# Patient Record
Sex: Male | Born: 1960
Health system: Southern US, Community
[De-identification: ages and names within clinical notes are randomized; demographics above are authoritative.]

## PROBLEM LIST (undated history)

## (undated) DIAGNOSIS — Z973 Presence of spectacles and contact lenses: Secondary | ICD-10-CM

## (undated) DIAGNOSIS — G4730 Sleep apnea, unspecified: Secondary | ICD-10-CM

## (undated) DIAGNOSIS — S46219A Strain of muscle, fascia and tendon of other parts of biceps, unspecified arm, initial encounter: Secondary | ICD-10-CM

## (undated) DIAGNOSIS — Z889 Allergy status to unspecified drugs, medicaments and biological substances status: Secondary | ICD-10-CM

## (undated) DIAGNOSIS — M1990 Unspecified osteoarthritis, unspecified site: Secondary | ICD-10-CM

## (undated) DIAGNOSIS — E7800 Pure hypercholesterolemia, unspecified: Secondary | ICD-10-CM

## (undated) HISTORY — PX: TONSILLECTOMY: SUR1361

## (undated) HISTORY — PX: WISDOM TOOTH EXTRACTION: SHX21

## (undated) HISTORY — PX: TONSILLECTOMY AND ADENOIDECTOMY: SUR1326

## (undated) HISTORY — PX: JOINT REPLACEMENT: SHX530

## (undated) HISTORY — PX: APPENDECTOMY: SHX54

## (undated) HISTORY — PX: COLONOSCOPY: SHX174

## (undated) HISTORY — PX: FOOT SURGERY: SHX648

---

## 1998-06-02 ENCOUNTER — Emergency Department (HOSPITAL_COMMUNITY): Admission: EM | Admit: 1998-06-02 | Discharge: 1998-06-02 | Payer: Self-pay | Admitting: *Deleted

## 1998-06-02 ENCOUNTER — Encounter: Payer: Self-pay | Admitting: *Deleted

## 2006-11-25 ENCOUNTER — Encounter: Admission: RE | Admit: 2006-11-25 | Discharge: 2006-11-25 | Payer: Self-pay | Admitting: Orthopedic Surgery

## 2007-02-26 ENCOUNTER — Encounter: Admission: RE | Admit: 2007-02-26 | Discharge: 2007-02-26 | Payer: Self-pay | Admitting: Orthopedic Surgery

## 2007-07-19 ENCOUNTER — Inpatient Hospital Stay (HOSPITAL_COMMUNITY): Admission: RE | Admit: 2007-07-19 | Discharge: 2007-07-22 | Payer: Self-pay | Admitting: Orthopedic Surgery

## 2009-02-24 IMAGING — CR DG CHEST 2V
2 series · 2 of 2 positions shown · non-contrast
Comparison: None.

CLINICAL DATA: Preop for osteoarthritis of the left hip.
 CHEST ? 2 VIEW:

[w chest pa]
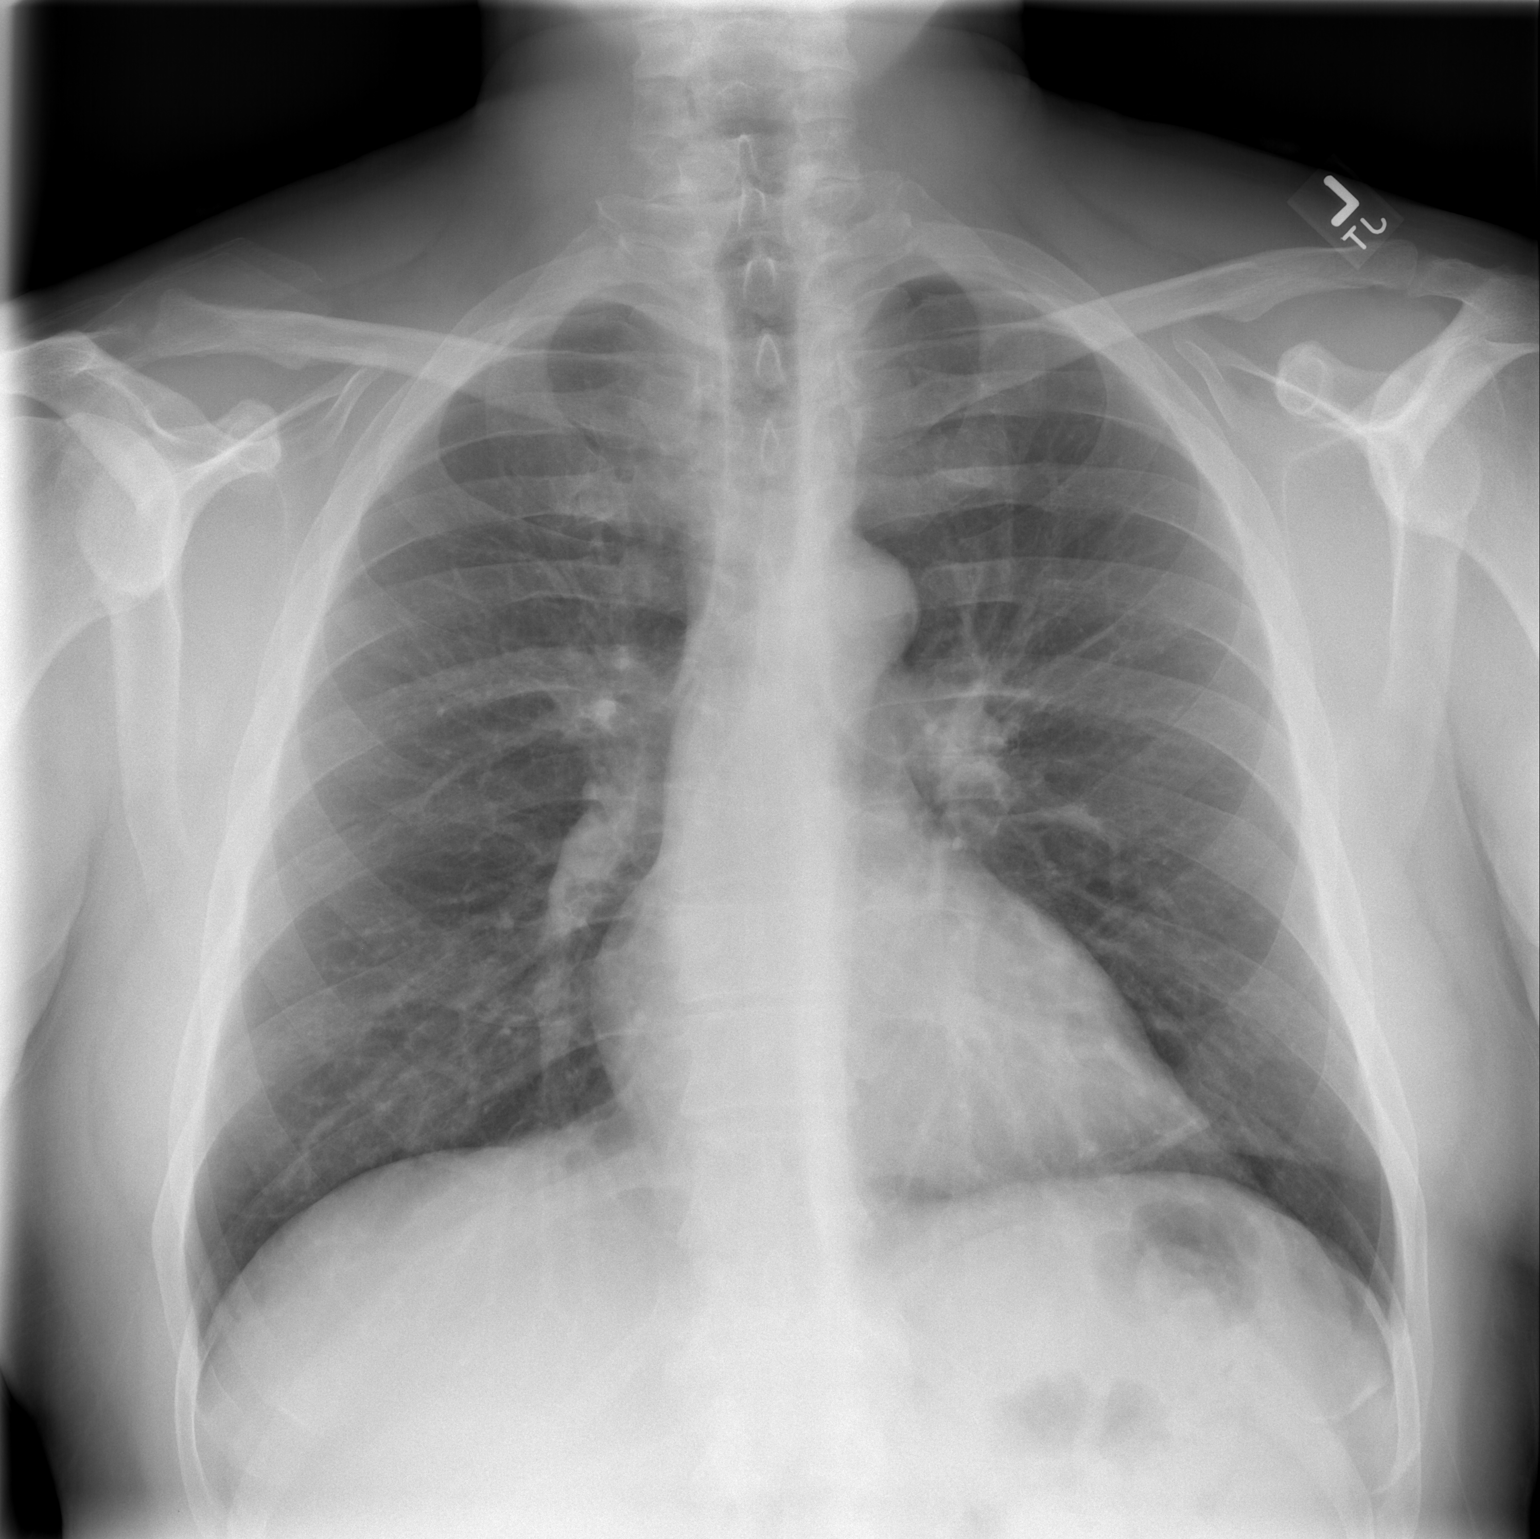

[w chest lat]
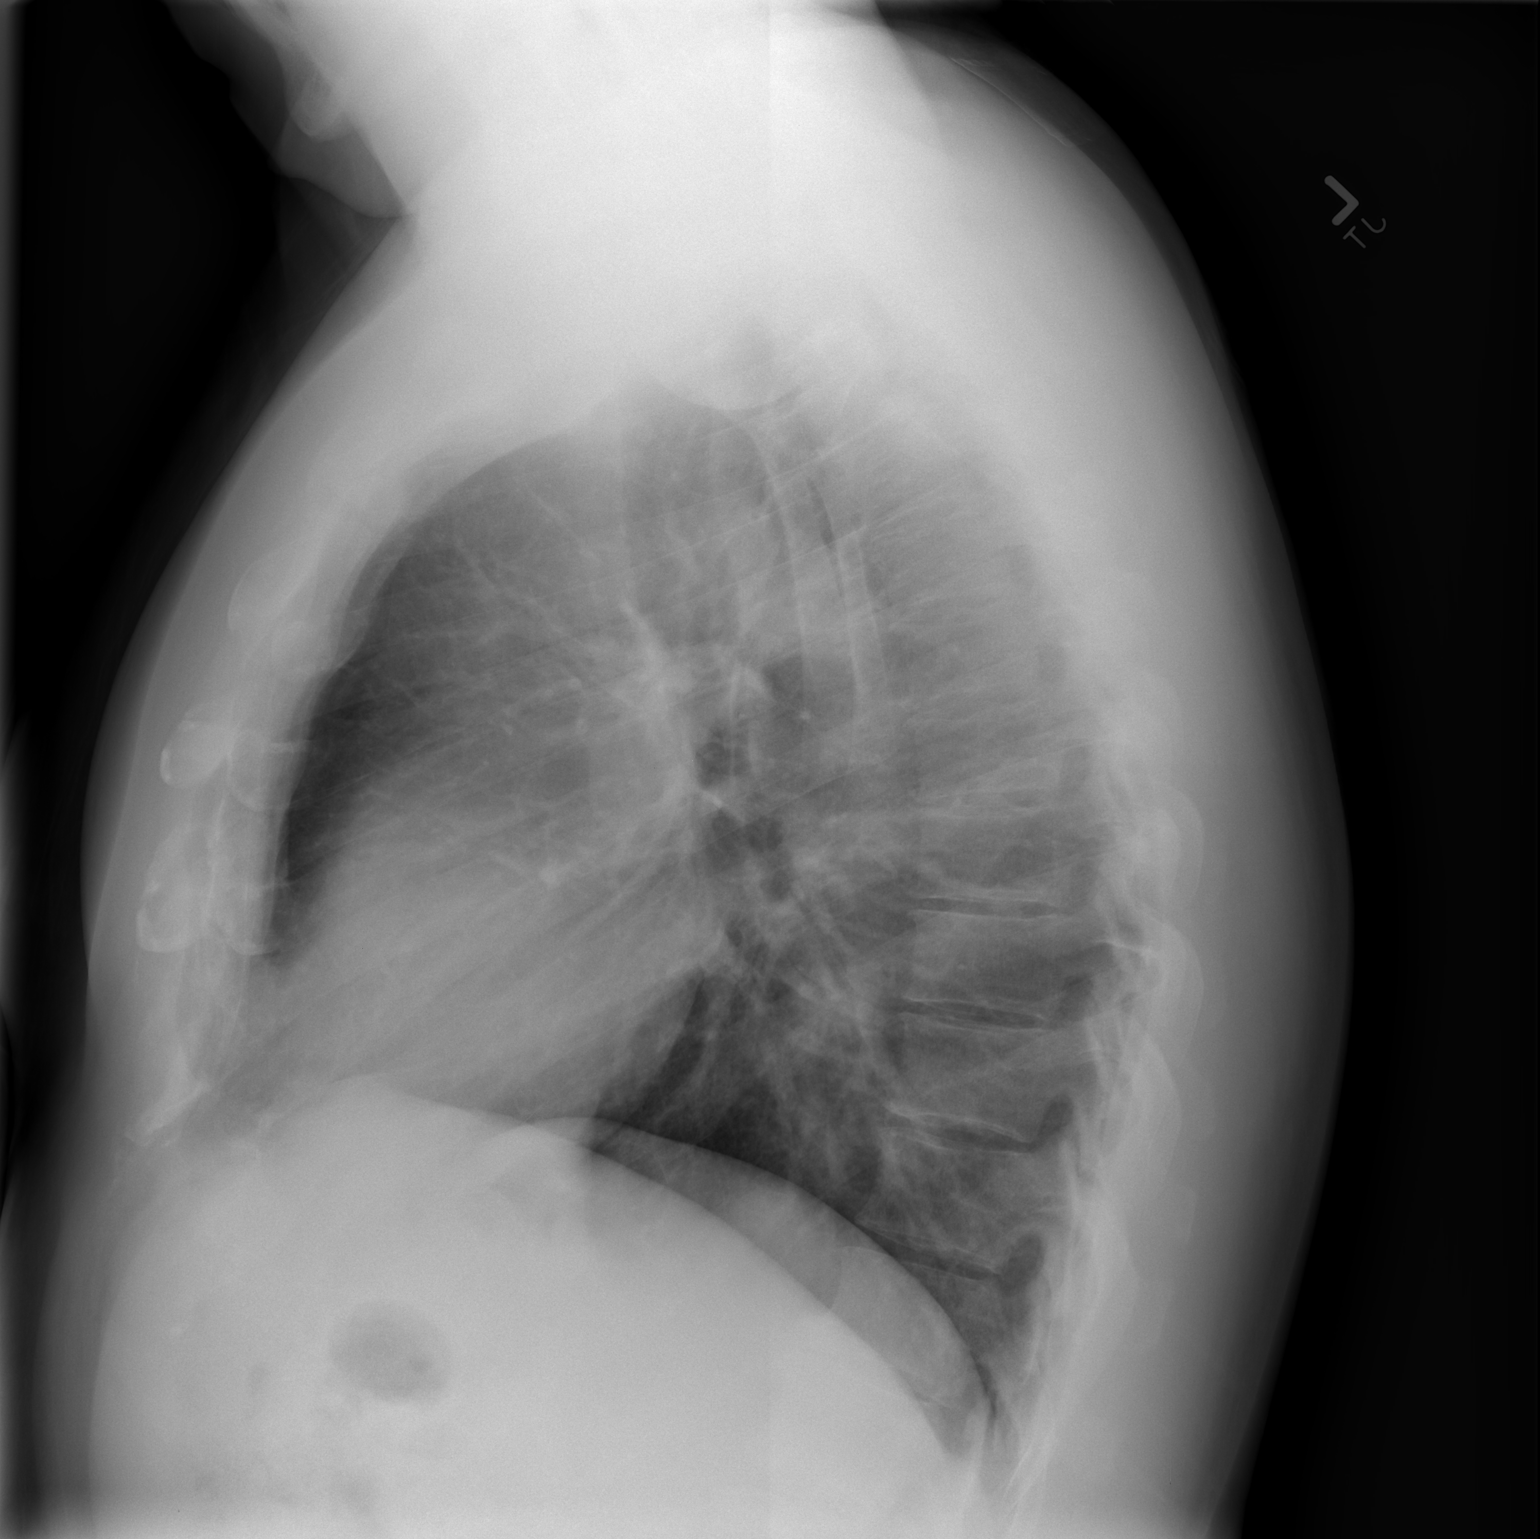

[2 of 2 positions shown; findings below may reference images not displayed]

FINDINGS: The cardiomediastinal silhouette is unremarkable.  There is no acute infiltrate or pleural effusion.  There is no pulmonary edema.
IMPRESSION: No acute cardiopulmonary disease.

## 2010-10-22 NOTE — H&P (Signed)
NAME:  LENUS, TRAUGER NO.:  [id number]   MEDICAL RECORD NO.:  [id number]         PATIENT TYPE:  LINP   LOCATION:                               FACILITY:  [facility]   PHYSICIAN:  Madlyn Frankel. Charlann Boxer, M.D.  DATE OF BIRTH:  05-May-1961   DATE OF ADMISSION:  07/19/2007  DATE OF DISCHARGE:                              HISTORY & PHYSICAL   PROCEDURE:  Left total hip arthroplasty.   CHIEF COMPLAINT:  Left hip and groin pain.   HISTORY OF PRESENT ILLNESS:  This is a 50 year old male with a history  of left hip and groin pain secondary to osteoarthritis.  It has been  refractory to all conservative treatment including two failed intra-  articular injections.  He is very active, has diminished quality of  life, and has been presurgically cleared for surgery.   PAST MEDICAL HISTORY SIGNIFICANT FOR:  1. Osteoarthritis.  2. Hypercholesteremia.   PAST SURGICAL HISTORY:  1. Appendectomy.  2. Uvula removed in 1995 for sleep apnea.   FAMILY HISTORY:  Diabetes, cancer, arthritis.   SOCIAL HISTORY:  Married, Pensions consultant.  Primary caregiver will be his wife  after surgery.   DRUG ALLERGIES:  No known drug allergies.   MEDICATIONS:  1. Mobic 7.5 mg one p.o. p.r.n.  2. Welchol 625 mg 6 tablets per day in divided doses.   REVIEW OF SYSTEMS:  None other than HPI.   PHYSICAL EXAMINATION:  VITAL SIGNS:  Pulse 64, respirations 18, blood  pressure 110/78.  GENERAL:  Awake, alert and oriented, well-developed, and well-nourished.  NECK:  Supple.  No carotid bruits.  CHEST:  Lungs are clear to auscultation bilaterally.  BREASTS:  Deferred.  HEART:  Regular rate and rhythm without gallops, clicks, rubs, or  murmurs.  ABDOMEN:  Soft, nontender, nondistended and bowel sounds are present.  GENITOURINARY:  Deferred.  EXTREMITIES:  His left hip has very limited internal range of motion as  well as flexion.  SKIN:  No cellulitis.  NEUROLOGIC:  Intact distal sensibilities.   LABS:  EKG and  chest x-ray all pending, presurgical testing.   IMPRESSION:  Left hip osteoarthritis.   PLAN OF ACTION:  Left total hip arthroplasty at [facility] on  July 19, 2007, by surgeon Dr. Durene Romans.  Questions were  encouraged, answered, and reviewed.  Risks and complications were  discussed.   Postoperative medications including Lovenox Robaxin, iron, aspirin,  Colace, and MiraLax provided at time of history and physical.  Pain  medicines will be provided at the time of surgery.     ______________________________  Yetta Glassman [address], [place]      Madlyn Frankel. Charlann Boxer, M.D.  Electronically Signed    BLM/MEDQ  D:  07/14/2007  T:  07/14/2007  Job:  [id number]   cc:   Alfonse Alpers. Dagoberto Ligas, M.D.  Fax: [phone]

## 2010-10-22 NOTE — Op Note (Signed)
NAME:  [name] [name], [name] [name] NO.:  [id number]   MEDICAL RECORD NO.:  [id number]          PATIENT TYPE:  INP   LOCATION:  1611                         FACILITY:  [facility]   PHYSICIAN:  Madlyn Frankel. Charlann Boxer, M.D.  DATE OF BIRTH:  1961/01/18   DATE OF PROCEDURE:  DATE OF DISCHARGE:                               OPERATIVE REPORT   PREOPERATIVE DIAGNOSIS:  Left hip osteoarthritis.   POSTOPERATIVE DIAGNOSIS:  Left hip osteoarthritis.   PROCEDURE:  Left total hip replacement.   COMPONENTS USED:  Pugh hip systems, 56 SR cup. A 7 high offset Tri-Lock  stem with a 49 + 2 ASR adaptor.   SURGEON:  Madlyn Frankel. Charlann Boxer, MD   ASSISTANT:  Neldon Newport   ANESTHESIA:  General.   BLOOD LOSS:  400 mL   DRAINS:  Times 1.   COMPLICATIONS:  None.   INDICATIONS FOR PROCEDURE:  Mr. [name] [name] is a 50 year old male with  advanced left hip osteoarthritis, progressive loss of function, and pain  with normal activities.  He wished to discuss options.  Based on the  decreased quality of life and radiographic, he had end-stage bone-on-  bone changes of the femoral head and acetabula changes.  There were very  little non surgical options available.  He failed 2 separate courses of  injections.  We thus discussed surgical options including the risks and  benefits of standard hip surgery, the bearing surfaces available, and  the activity level afterwards.  After this discussion, we decided on a  total hip replacement with the large metal-on-metal ASR technology.  Consent was obtained.   PROCEDURE IN DETAIL:  The patient was brought to the operative theater.  Once adequate anesthesia and preoperative antibiotics, 2 grams of Ancef  was administered.  The patient was positioned in the right lateral  decubitus position with the left side up.  All bony prominences were  well padded.  The left lower extremity was prepped and draped down to  the perineum and then pre scrubbed and prepped and draped in a  sterile  fashion.  A lateral base incision was made for a posterior approach to  the hip.  The iliotibial band and gluteus fascia were incised  posteriorly.  Short external rotators were identified and taken down and  separated from the posterior capsule.  I then made a L-capsulotomy at  the posterior capsule preserving the posterior capsule to protect the  sciatic nerve from retractors and then later anatomic repair to the  posterior leaflets.  Hip was dislocated.  Severe osteoarthritis was  noted. The neck osteotomy was then made based on anatomic landamrks and  preoperative templating.  Attention was first directed to the femur.  Using a box osteotome to insure that I was getting it lateral enough,  then I started to drill and then hammering irrigated the canal to  prevent an emboli.  I then began broaching the one broach and carried my  broach all the way up to initially to a size 6 broach. I found that my  neck cut was by a couple of millimeters. I went ahead  and used a calcar  planar to finish off the cut.   At this point, I removed this broach and packed the area with a sponge.  My attention now is to the acetabulum.  The acetabula retractors were  placed.  Following using a long-handled knife blade, I removed the  labral tissue.  I began reaming with a 43 reamer and then used a curved  reamer up to a 55 reamer.  At that point, I had an excellent prepared  bony bed.  There were some large cysts that were debrided and then  packed the femoral head with autograft.  A final 56 ASR cut was then  impacted at the curve handle with 40 degrees of abduction and 20 of  forward flexion mostly at the anterior wall based on the location of my  reamers and the cup was jsut beneath the anterior wall anteriorly.   At this point, I directed my attention to the femur and performed a  trial reduction. I first impacted a size 6 broach to the level of my  neck cut.  I was concerned that the leg  length were off by a little  given excessive shuck.  I then removed the size 6 broach and place the  7.  I used the high offset neck and trialed a plus-2 adapter head. I  felt that now the hip stability was very good. Leg length compared to  the down leg did not appear to be overly lengthened.  If anything, it is  the same length as the other leg.   At this point, all trails and components were removed.  The final canal  preparation was carried out.  The 7 high offset stem was then impacted  to the level where the braoch ahd been slightly above my original neck  cut, further assuring leg length equality.  I did retrial again. I was  very happy again with this hip stability and leg length compared to the  down leg.   At this point, and throughout the case, the hip was irrigated.  I  reapproximated the posterior capsule with the superior leaflet using a  number 1 Ethibond.  A medium Hemovac drain was placed deep.  The  iliotibial bands were reapproximated using a number 1 Ethibond and  number 1 Vicryl was used on the gluteal fascia.  The remaining wound was  closed with 2-0 Vicryl and a running 4-0 Monocryl.  The hip was clean,  dry, and dressed sterilely with Steri-Strips and a Mepilex dressing.  She was brought to the recovery room in excellent and stable condition.      Madlyn Frankel Charlann Boxer, M.D.  Electronically Signed     MDO/MEDQ  D:  07/19/2007  T:  07/20/2007  Job:  [id number]

## 2010-10-25 NOTE — Discharge Summary (Signed)
NAME:  [name] [name] NO.:  [id number]   MEDICAL RECORD NO.:  [id number]          PATIENT TYPE:  INP   LOCATION:  1611                         FACILITY:  [facility]   PHYSICIAN:  Madlyn Frankel. Charlann Boxer, M.D.  DATE OF BIRTH:  [birth year]   DATE OF ADMISSION:  07/19/2007  DATE OF DISCHARGE:  07/22/2007                               DISCHARGE SUMMARY   ADMITTING DIAGNOSES:  1. Osteoarthritis.  2. Hypercholesteremia.   DISCHARGE DIAGNOSES:  1. Osteoarthritis.  2. Hypercholesteremia.   HISTORY OF PRESENT ILLNESS:  A 50 year old male with a history of left  hip and groin pain secondary to osteoarthritis.  It was refractory to  all conservative treatment and two failed intra-articular injections.   CONSULTS:  None.   PROCEDURE:  Left total hip arthroplasty.  Surgeon was Dr. Durene Romans.  Assistant was [employer], [employer].  Components were metal-on-metal.   LABS PREADMISSION:  CBC:  Hematocrit 42.6, at discharge 24.4.  White  cell differential normal.  Coagulation normal.  Routine chemistry  preoperative:  Glucose a little high at 111, all others normal.  At  discharge, sodium 135, potassium 3.7, glucose 108.  Kidney function:  GFR greater than 60, calcium 8.2.  UA negative.   RADIOLOGY:  Chest two-view:  No acute disease.   CARDIOLOGY:  EKG:  Normal sinus rhythm.   HOSPITAL COURSE:  The patient underwent left total hip placement and  tolerated the procedure well, admitted to orthopedic floor.  Postoperative day #1, no events, afebrile.  Did have a drop in his  hematocrit to 28.6.  His dressing was clean, dry and intact.  His  Hemovac was discontinued.  Neurovascularly intact.  DVT prophylaxis,  Lovenox was started.  We stopped the Vicodin and started him on Percocet  due to increased pain.  PT evaluation done with the initial  recommendation for home health PT.   Postoperative day #2, got up to go to the bathroom, had some increased  bleeding from his wound.  His  hematocrit was 25.3, all others labs  within normal limits.  Dressing was changed.  He did have some bloody  ooze with ecchymosis.  He was neurovascularly intact to his left lower  extremity.  We held Lovenox for recheck the next day of his H&H.  We Hep-  Locked his IV.  Did well with his physical therapy, ambulated 200 feet.   Postoperative day #3, doing fine, no events, afebrile.  Hemoglobin was  8.6.  He did have ecchymosis, swelling with underlying intramuscular  hematoma.  Neurovascularly intact with continued plans for discharge  home with home health care PT due to ample progress.   DISCHARGE DISPOSITION:  Discharged home in stable, improved condition.   DISCHARGE DIET:  Regular.   DISCHARGE WOUND CARE:  Keep dry.   DISCHARGE PHYSICAL THERAPY:  Weightbearing as tolerated with the use of  a rolling walker.   DISCHARGE MEDICATIONS:  1. Lovenox.  2. Robaxin.  3. Iron.  4. Aspirin.  5. Colace.  6. MiraLax.  7. Percocet.   HOME MEDICATIONS:  1. Welchol 650 mg three tablets b.i.d.  2. Tylenol p.r.n.   DISCHARGE FOLLOW-UP:  Dr. Charlann Boxer, phone number [phone] in 10 to 14 days.     ______________________________  Yetta Glassman [address], [place]      Madlyn Frankel. Charlann Boxer, M.D.  Electronically Signed    BLM/MEDQ  D:  08/31/2007  T:  08/31/2007  Job:  [id number]

## 2011-02-28 LAB — DIFFERENTIAL
Basophils Absolute: 0
Basophils Relative: 0
Eosinophils Absolute: 0.2
Eosinophils Relative: 3
Lymphocytes Relative: 23
Lymphs Abs: 1.5
Monocytes Absolute: 0.4
Monocytes Relative: 7
Neutro Abs: 4.2
Neutrophils Relative %: 67

## 2011-02-28 LAB — URINALYSIS, ROUTINE W REFLEX MICROSCOPIC
Bilirubin Urine: NEGATIVE
Glucose, UA: NEGATIVE
Hgb urine dipstick: NEGATIVE
Ketones, ur: NEGATIVE
Nitrite: NEGATIVE
Protein, ur: NEGATIVE
Specific Gravity, Urine: 1.014
Urobilinogen, UA: 0.2
pH: 7

## 2011-02-28 LAB — CBC
HCT: 25.3 — ABNORMAL LOW
HCT: 28.6 — ABNORMAL LOW
HCT: 42.6
Hemoglobin: 10.1 — ABNORMAL LOW
Hemoglobin: 14.9
Hemoglobin: 8.9 — ABNORMAL LOW
MCHC: 35
MCHC: 35.2
MCHC: 35.3
MCV: 86.4
MCV: 86.9
MCV: 87.2
Platelets: 171
Platelets: 204
Platelets: 241
RBC: 2.9 — ABNORMAL LOW
RBC: 3.29 — ABNORMAL LOW
RBC: 4.93
RDW: 13.2
RDW: 13.3
RDW: 13.4
WBC: 5.8
WBC: 5.9
WBC: 6.3

## 2011-02-28 LAB — BASIC METABOLIC PANEL
BUN: 10
BUN: 17
BUN: 3 — ABNORMAL LOW
CO2: 29
CO2: 30
CO2: 30
Calcium: 8.1 — ABNORMAL LOW
Calcium: 8.2 — ABNORMAL LOW
Calcium: 9.8
Chloride: 101
Chloride: 102
Chloride: 102
Creatinine, Ser: 0.91
Creatinine, Ser: 0.97
Creatinine, Ser: 1.02
GFR calc Af Amer: >60
GFR calc Af Amer: >60
GFR calc Af Amer: >60
GFR calc non Af Amer: >60
GFR calc non Af Amer: >60
GFR calc non Af Amer: >60
Glucose, Bld: 108 — ABNORMAL HIGH
Glucose, Bld: 111 — ABNORMAL HIGH
Glucose, Bld: 125 — ABNORMAL HIGH
Potassium: 3.7
Potassium: 3.8
Potassium: 4.8
Sodium: 135
Sodium: 135
Sodium: 139

## 2011-02-28 LAB — APTT: aPTT: 30

## 2011-02-28 LAB — ABO/RH: ABO/RH(D): O POS

## 2011-02-28 LAB — TYPE AND SCREEN
ABO/RH(D): O POS
Antibody Screen: NEGATIVE

## 2011-02-28 LAB — PROTIME-INR
INR: 0.9
Prothrombin Time: 12.8

## 2011-02-28 LAB — HEMOGLOBIN AND HEMATOCRIT, BLOOD
HCT: 24.4 — ABNORMAL LOW
Hemoglobin: 8.6 — ABNORMAL LOW

## 2011-08-26 DIAGNOSIS — C4491 Basal cell carcinoma of skin, unspecified: Secondary | ICD-10-CM

## 2011-08-26 HISTORY — DX: Basal cell carcinoma of skin, unspecified: C44.91

## 2014-09-13 DIAGNOSIS — E663 Overweight: Secondary | ICD-10-CM | POA: Insufficient documentation

## 2014-09-13 DIAGNOSIS — J309 Allergic rhinitis, unspecified: Secondary | ICD-10-CM | POA: Insufficient documentation

## 2014-09-13 DIAGNOSIS — G4733 Obstructive sleep apnea (adult) (pediatric): Secondary | ICD-10-CM | POA: Insufficient documentation

## 2015-09-20 DIAGNOSIS — G4733 Obstructive sleep apnea (adult) (pediatric): Secondary | ICD-10-CM | POA: Diagnosis not present

## 2015-09-20 DIAGNOSIS — E291 Testicular hypofunction: Secondary | ICD-10-CM | POA: Insufficient documentation

## 2015-09-20 DIAGNOSIS — E7800 Pure hypercholesterolemia, unspecified: Secondary | ICD-10-CM | POA: Insufficient documentation

## 2015-09-20 DIAGNOSIS — R61 Generalized hyperhidrosis: Secondary | ICD-10-CM | POA: Insufficient documentation

## 2015-09-20 DIAGNOSIS — G4700 Insomnia, unspecified: Secondary | ICD-10-CM | POA: Insufficient documentation

## 2015-09-20 DIAGNOSIS — M2550 Pain in unspecified joint: Secondary | ICD-10-CM | POA: Insufficient documentation

## 2015-09-20 DIAGNOSIS — N529 Male erectile dysfunction, unspecified: Secondary | ICD-10-CM | POA: Insufficient documentation

## 2015-10-22 DIAGNOSIS — E291 Testicular hypofunction: Secondary | ICD-10-CM | POA: Diagnosis not present

## 2015-10-22 DIAGNOSIS — E7800 Pure hypercholesterolemia, unspecified: Secondary | ICD-10-CM | POA: Diagnosis not present

## 2015-10-22 DIAGNOSIS — G4733 Obstructive sleep apnea (adult) (pediatric): Secondary | ICD-10-CM | POA: Diagnosis not present

## 2015-10-22 DIAGNOSIS — Z125 Encounter for screening for malignant neoplasm of prostate: Secondary | ICD-10-CM | POA: Diagnosis not present

## 2015-10-22 DIAGNOSIS — Z23 Encounter for immunization: Secondary | ICD-10-CM | POA: Diagnosis not present

## 2015-10-22 DIAGNOSIS — R030 Elevated blood-pressure reading, without diagnosis of hypertension: Secondary | ICD-10-CM | POA: Diagnosis not present

## 2015-10-22 DIAGNOSIS — N529 Male erectile dysfunction, unspecified: Secondary | ICD-10-CM | POA: Diagnosis not present

## 2015-10-22 DIAGNOSIS — Z0000 Encounter for general adult medical examination without abnormal findings: Secondary | ICD-10-CM | POA: Diagnosis not present

## 2015-12-07 DIAGNOSIS — Z125 Encounter for screening for malignant neoplasm of prostate: Secondary | ICD-10-CM | POA: Diagnosis not present

## 2015-12-07 DIAGNOSIS — E7800 Pure hypercholesterolemia, unspecified: Secondary | ICD-10-CM | POA: Diagnosis not present

## 2015-12-19 DIAGNOSIS — D485 Neoplasm of uncertain behavior of skin: Secondary | ICD-10-CM | POA: Diagnosis not present

## 2015-12-19 DIAGNOSIS — L821 Other seborrheic keratosis: Secondary | ICD-10-CM | POA: Diagnosis not present

## 2015-12-19 DIAGNOSIS — L570 Actinic keratosis: Secondary | ICD-10-CM | POA: Diagnosis not present

## 2016-02-20 DIAGNOSIS — L570 Actinic keratosis: Secondary | ICD-10-CM | POA: Diagnosis not present

## 2016-02-20 DIAGNOSIS — D485 Neoplasm of uncertain behavior of skin: Secondary | ICD-10-CM | POA: Diagnosis not present

## 2016-03-25 DIAGNOSIS — G4733 Obstructive sleep apnea (adult) (pediatric): Secondary | ICD-10-CM | POA: Diagnosis not present

## 2016-04-14 DIAGNOSIS — M2011 Hallux valgus (acquired), right foot: Secondary | ICD-10-CM | POA: Diagnosis not present

## 2016-04-14 DIAGNOSIS — M205X9 Other deformities of toe(s) (acquired), unspecified foot: Secondary | ICD-10-CM | POA: Insufficient documentation

## 2016-04-14 DIAGNOSIS — M205X1 Other deformities of toe(s) (acquired), right foot: Secondary | ICD-10-CM | POA: Insufficient documentation

## 2016-04-14 DIAGNOSIS — M2012 Hallux valgus (acquired), left foot: Secondary | ICD-10-CM | POA: Diagnosis not present

## 2016-04-14 DIAGNOSIS — M2010 Hallux valgus (acquired), unspecified foot: Secondary | ICD-10-CM | POA: Insufficient documentation

## 2016-04-14 DIAGNOSIS — M205X2 Other deformities of toe(s) (acquired), left foot: Secondary | ICD-10-CM | POA: Diagnosis not present

## 2016-08-07 DIAGNOSIS — F4325 Adjustment disorder with mixed disturbance of emotions and conduct: Secondary | ICD-10-CM | POA: Diagnosis not present

## 2016-08-28 DIAGNOSIS — F4325 Adjustment disorder with mixed disturbance of emotions and conduct: Secondary | ICD-10-CM | POA: Diagnosis not present

## 2016-09-19 DIAGNOSIS — E291 Testicular hypofunction: Secondary | ICD-10-CM | POA: Diagnosis not present

## 2016-09-29 DIAGNOSIS — F4325 Adjustment disorder with mixed disturbance of emotions and conduct: Secondary | ICD-10-CM | POA: Diagnosis not present

## 2016-10-28 DIAGNOSIS — M2550 Pain in unspecified joint: Secondary | ICD-10-CM | POA: Diagnosis not present

## 2016-10-28 DIAGNOSIS — E7800 Pure hypercholesterolemia, unspecified: Secondary | ICD-10-CM | POA: Diagnosis not present

## 2016-10-28 DIAGNOSIS — E291 Testicular hypofunction: Secondary | ICD-10-CM | POA: Diagnosis not present

## 2016-10-28 DIAGNOSIS — Z0000 Encounter for general adult medical examination without abnormal findings: Secondary | ICD-10-CM | POA: Diagnosis not present

## 2016-10-28 DIAGNOSIS — N529 Male erectile dysfunction, unspecified: Secondary | ICD-10-CM | POA: Diagnosis not present

## 2016-11-07 DIAGNOSIS — Z125 Encounter for screening for malignant neoplasm of prostate: Secondary | ICD-10-CM | POA: Diagnosis not present

## 2016-11-07 DIAGNOSIS — E7800 Pure hypercholesterolemia, unspecified: Secondary | ICD-10-CM | POA: Diagnosis not present

## 2016-11-20 DIAGNOSIS — F4325 Adjustment disorder with mixed disturbance of emotions and conduct: Secondary | ICD-10-CM | POA: Diagnosis not present

## 2016-12-08 DIAGNOSIS — E291 Testicular hypofunction: Secondary | ICD-10-CM | POA: Diagnosis not present

## 2017-02-11 DIAGNOSIS — Z96642 Presence of left artificial hip joint: Secondary | ICD-10-CM | POA: Diagnosis not present

## 2017-02-13 DIAGNOSIS — M25552 Pain in left hip: Secondary | ICD-10-CM | POA: Diagnosis not present

## 2017-02-13 DIAGNOSIS — Z96642 Presence of left artificial hip joint: Secondary | ICD-10-CM | POA: Diagnosis not present

## 2017-08-28 DIAGNOSIS — G4733 Obstructive sleep apnea (adult) (pediatric): Secondary | ICD-10-CM | POA: Diagnosis not present

## 2017-09-13 ENCOUNTER — Ambulatory Visit (INDEPENDENT_AMBULATORY_CARE_PROVIDER_SITE_OTHER): Payer: Self-pay | Admitting: Orthopedic Surgery

## 2017-09-13 DIAGNOSIS — S46212A Strain of muscle, fascia and tendon of other parts of biceps, left arm, initial encounter: Secondary | ICD-10-CM

## 2017-09-14 ENCOUNTER — Encounter (HOSPITAL_COMMUNITY): Payer: Self-pay | Admitting: *Deleted

## 2017-09-14 ENCOUNTER — Other Ambulatory Visit (INDEPENDENT_AMBULATORY_CARE_PROVIDER_SITE_OTHER): Payer: Self-pay | Admitting: Orthopedic Surgery

## 2017-09-14 ENCOUNTER — Other Ambulatory Visit: Payer: Self-pay

## 2017-09-14 ENCOUNTER — Encounter (INDEPENDENT_AMBULATORY_CARE_PROVIDER_SITE_OTHER): Payer: Self-pay | Admitting: Orthopedic Surgery

## 2017-09-14 DIAGNOSIS — S46212A Strain of muscle, fascia and tendon of other parts of biceps, left arm, initial encounter: Secondary | ICD-10-CM

## 2017-09-14 NOTE — Progress Notes (Signed)
Pt denies SOB, chest pain, and being under the care of a cardiologist. Pt denies having a stress test, echo and cardiac cath. Pt denies having an EKG and chest x ray within the last year. Pt denies recent labs. Pt made aware to stop taking vitamins, fish oil and herbal medications. Do not take any NSAIDs ie: Ibuprofen, Advil, Naproxen (Aleve), Mobic, Motrin, BC and Goody Powder. Pt verbalized understanding of all pre-op instructions.

## 2017-09-14 NOTE — Anesthesia Preprocedure Evaluation (Addendum)
Anesthesia Evaluation  Patient identified by MRN, date of birth, ID band Patient awake    Reviewed: Allergy & Precautions, H&P , NPO status , Patient's Chart, lab work & pertinent test results  Airway Mallampati: II  TM Distance: >3 FB Neck ROM: Full    Dental no notable dental hx.    Pulmonary sleep apnea and Continuous Positive Airway Pressure Ventilation ,    Pulmonary exam normal breath sounds clear to auscultation       Cardiovascular Exercise Tolerance: Good Normal cardiovascular exam Rhythm:Regular Rate:Normal     Neuro/Psych negative neurological ROS  negative psych ROS   GI/Hepatic Neg liver ROS,   Endo/Other  negative endocrine ROS  Renal/GU negative Renal ROS     Musculoskeletal   Abdominal   Peds  Hematology negative hematology ROS (+)   Anesthesia Other Findings   Reproductive/Obstetrics                          Lab Results  Component Value Date   WBC 4.8 09/15/2017   HGB 15.8 09/15/2017   HCT 45.6 09/15/2017   MCV 87.4 09/15/2017   PLT 195 09/15/2017    Anesthesia Physical Anesthesia Plan  ASA: II  Anesthesia Plan: General and Regional   Post-op Pain Management:  Regional for Post-op pain   Induction: Intravenous  PONV Risk Score and Plan: Treatment may vary due to age or medical condition and Ondansetron  Airway Management Planned: Oral ETT  Additional Equipment:   Intra-op Plan:   Post-operative Plan: Extubation in OR  Informed Consent: I have reviewed the patients History and Physical, chart, labs and discussed the procedure including the risks, benefits and alternatives for the proposed anesthesia with the patient or authorized representative who has indicated his/her understanding and acceptance.   Dental advisory given  Plan Discussed with: CRNA  Anesthesia Plan Comments:         Anesthesia Quick Evaluation

## 2017-09-14 NOTE — Progress Notes (Signed)
   Office Visit Note   Patient: [name] [name]           Date of Birth: [birth year]           MRN: [id number] Visit Date: 09/13/2017 Requested by: No referring provider defined for this encounter. PCP: No primary care provider on file.  Subjective: No chief complaint on file.   HPI: It is a patient whom I am seeing at his house.  2 weeks ago he was in a tennis match and felt pain in the left elbow.  He is right-hand dominant.  He is gone on to develop persistent pain and weakness in that left arm.  He did have some medial sided ecchymosis and swelling which has resolved.  He denies any numbness or tingling in his fingers.              ROS: All systems reviewed are negative as they relate to the chief complaint within the history of present illness.  Patient denies  fevers or chills.   Assessment & Plan: Visit Diagnoses:  1. Biceps rupture, distal, left, initial encounter     Plan: Impression is left distal biceps rupture.  Plan is reattachment.  He is not yet 2 weeks out.  He does have clear and obvious retraction and difference in contours of the distal biceps as well as weakness to supination.  Risks and benefits are discussed including but not limited to infection nerve vessel damage heterotopic ossification.  Patient understands the risk and benefits and wishes to proceed.  We will do this on an expedited basis..  Follow-Up Instructions: No follow-ups on file.   Orders:  No orders of the defined types were placed in this encounter.  No orders of the defined types were placed in this encounter.     Procedures: No procedures performed   Clinical Data: No additional findings.  Objective: Vital Signs: There were no vitals taken for this visit.  Physical Exam:   Constitutional: Patient appears well-developed HEENT:  Head: Normocephalic Eyes:EOM are normal Neck: Normal range of motion Cardiovascular: Normal rate Pulmonary/chest: Effort normal Neurologic: Patient is  alert Skin: Skin is warm Psychiatric: Patient has normal mood and affect    Ortho Exam: Orthopedic exam demonstrates proximal migration of the distal biceps.  Patient has no palpable biceps tendon within the bicipital groove.  He has weakness and pain with resisted supination left versus right.  Elbow range of motion is full.  Radial pulse is intact.  EPL FPL interosseous is intact.  Specialty Comments:  No specialty comments available.  Imaging: No results found.   PMFS History: There are no active problems to display for this patient.  History reviewed. No pertinent past medical history.  History reviewed. No pertinent family history.  History reviewed. No pertinent surgical history. Social History   Occupational History  . Not on file  Tobacco Use  . Smoking status: Not on file  Substance and Sexual Activity  . Alcohol use: Not on file  . Drug use: Not on file  . Sexual activity: Not on file

## 2017-09-15 ENCOUNTER — Ambulatory Visit (HOSPITAL_COMMUNITY): Payer: BLUE CROSS/BLUE SHIELD | Admitting: Anesthesiology

## 2017-09-15 ENCOUNTER — Encounter (HOSPITAL_COMMUNITY)
Admission: RE | Disposition: A | Discharge status: Home / Self Care | Payer: Self-pay | Source: Ambulatory Visit | Attending: Orthopedic Surgery

## 2017-09-15 ENCOUNTER — Encounter (HOSPITAL_COMMUNITY): Payer: Self-pay | Admitting: Anesthesiology

## 2017-09-15 ENCOUNTER — Ambulatory Visit (HOSPITAL_COMMUNITY)
Admission: RE | Admit: 2017-09-15 | Discharge: 2017-09-15 | Disposition: A | Discharge status: Home / Self Care | Payer: BLUE CROSS/BLUE SHIELD | Source: Ambulatory Visit | Attending: Orthopedic Surgery | Admitting: Orthopedic Surgery

## 2017-09-15 DIAGNOSIS — Z820 Family history of epilepsy and other diseases of the nervous system: Secondary | ICD-10-CM | POA: Insufficient documentation

## 2017-09-15 DIAGNOSIS — Z833 Family history of diabetes mellitus: Secondary | ICD-10-CM | POA: Diagnosis not present

## 2017-09-15 DIAGNOSIS — S46212A Strain of muscle, fascia and tendon of other parts of biceps, left arm, initial encounter: Secondary | ICD-10-CM | POA: Insufficient documentation

## 2017-09-15 DIAGNOSIS — Y9389 Activity, other specified: Secondary | ICD-10-CM | POA: Insufficient documentation

## 2017-09-15 DIAGNOSIS — Z79899 Other long term (current) drug therapy: Secondary | ICD-10-CM | POA: Diagnosis not present

## 2017-09-15 DIAGNOSIS — Z888 Allergy status to other drugs, medicaments and biological substances status: Secondary | ICD-10-CM | POA: Diagnosis not present

## 2017-09-15 DIAGNOSIS — G4730 Sleep apnea, unspecified: Secondary | ICD-10-CM | POA: Insufficient documentation

## 2017-09-15 DIAGNOSIS — Z96643 Presence of artificial hip joint, bilateral: Secondary | ICD-10-CM | POA: Insufficient documentation

## 2017-09-15 DIAGNOSIS — G8918 Other acute postprocedural pain: Secondary | ICD-10-CM | POA: Diagnosis not present

## 2017-09-15 DIAGNOSIS — X58XXXA Exposure to other specified factors, initial encounter: Secondary | ICD-10-CM | POA: Diagnosis not present

## 2017-09-15 DIAGNOSIS — Z8249 Family history of ischemic heart disease and other diseases of the circulatory system: Secondary | ICD-10-CM | POA: Diagnosis not present

## 2017-09-15 DIAGNOSIS — E7800 Pure hypercholesterolemia, unspecified: Secondary | ICD-10-CM | POA: Insufficient documentation

## 2017-09-15 HISTORY — DX: Strain of muscle, fascia and tendon of other parts of biceps, unspecified arm, initial encounter: S46.219A

## 2017-09-15 HISTORY — DX: Pure hypercholesterolemia, unspecified: E78.00

## 2017-09-15 HISTORY — DX: Presence of spectacles and contact lenses: Z97.3

## 2017-09-15 HISTORY — DX: Sleep apnea, unspecified: G47.30

## 2017-09-15 HISTORY — PX: DISTAL BICEPS TENDON REPAIR: SHX1461

## 2017-09-15 HISTORY — DX: Unspecified osteoarthritis, unspecified site: M19.90

## 2017-09-15 HISTORY — DX: Allergy status to unspecified drugs, medicaments and biological substances: Z88.9

## 2017-09-15 LAB — CBC
HCT: 45.6 % (ref 39.0–52.0)
Hemoglobin: 15.8 g/dL (ref 13.0–17.0)
MCH: 30.3 pg (ref 26.0–34.0)
MCHC: 34.6 g/dL (ref 30.0–36.0)
MCV: 87.4 fL (ref 78.0–100.0)
Platelets: 195 10*3/uL (ref 150–400)
RBC: 5.22 MIL/uL (ref 4.22–5.81)
RDW: 13 % (ref 11.5–15.5)
WBC: 4.8 10*3/uL (ref 4.0–10.5)

## 2017-09-15 SURGERY — REPAIR, TENDON, BICEPS, DISTAL
Anesthesia: Regional | Site: Arm Upper | Laterality: Left

## 2017-09-15 MED ORDER — MORPHINE SULFATE (PF) 4 MG/ML IV SOLN
INTRAVENOUS | Status: DC | PRN
  Administered 2017-09-15: 8 mg

## 2017-09-15 MED ORDER — CHLORHEXIDINE GLUCONATE 4 % EX LIQD: 60.0000 mL | Freq: Once | CUTANEOUS | Status: DC

## 2017-09-15 MED ORDER — FENTANYL CITRATE (PF) 100 MCG/2ML IJ SOLN
INTRAMUSCULAR | Status: AC
  Filled 2017-09-15: qty 2

## 2017-09-15 MED ORDER — MIDAZOLAM HCL 5 MG/5ML IJ SOLN
INTRAMUSCULAR | Status: DC | PRN
  Administered 2017-09-15 (×2): 1 mg via INTRAVENOUS

## 2017-09-15 MED ORDER — BUPIVACAINE HCL (PF) 0.25 % IJ SOLN
INTRAMUSCULAR | Status: AC
  Filled 2017-09-15: qty 30

## 2017-09-15 MED ORDER — KETOROLAC TROMETHAMINE 30 MG/ML IJ SOLN
INTRAMUSCULAR | Status: DC | PRN
  Administered 2017-09-15: 30 mg via INTRAVENOUS

## 2017-09-15 MED ORDER — FENTANYL CITRATE (PF) 100 MCG/2ML IJ SOLN
INTRAMUSCULAR | Status: DC | PRN
  Administered 2017-09-15: 25 ug via INTRAVENOUS
  Administered 2017-09-15: 50 ug via INTRAVENOUS

## 2017-09-15 MED ORDER — LACTATED RINGERS IV SOLN
INTRAVENOUS | Status: DC | PRN
  Administered 2017-09-15 (×2): via INTRAVENOUS

## 2017-09-15 MED ORDER — CEFAZOLIN SODIUM-DEXTROSE 2-4 GM/100ML-% IV SOLN
2.0000 g | INTRAVENOUS | Status: AC
  Administered 2017-09-15: 2 g via INTRAVENOUS
  Filled 2017-09-15: qty 100

## 2017-09-15 MED ORDER — ONDANSETRON HCL 4 MG/2ML IJ SOLN
INTRAMUSCULAR | Status: DC | PRN
  Administered 2017-09-15: 4 mg via INTRAVENOUS

## 2017-09-15 MED ORDER — 0.9 % SODIUM CHLORIDE (POUR BTL) OPTIME
TOPICAL | Status: DC | PRN
  Administered 2017-09-15 (×2): 1000 mL

## 2017-09-15 MED ORDER — PROPOFOL 10 MG/ML IV BOLUS
INTRAVENOUS | Status: DC | PRN
  Administered 2017-09-15: 150 mg via INTRAVENOUS
  Administered 2017-09-15: 50 mg via INTRAVENOUS

## 2017-09-15 MED ORDER — ROPIVACAINE HCL 5 MG/ML IJ SOLN
INTRAMUSCULAR | Status: DC | PRN
  Administered 2017-09-15: 30 mL via PERINEURAL

## 2017-09-15 MED ORDER — BUPIVACAINE HCL (PF) 0.25 % IJ SOLN
INTRAMUSCULAR | Status: DC | PRN
  Administered 2017-09-15: 20 mL

## 2017-09-15 MED ORDER — ACETAMINOPHEN 10 MG/ML IV SOLN: 1000.0000 mg | Freq: Once | INTRAVENOUS | Status: DC | PRN

## 2017-09-15 MED ORDER — CLONIDINE HCL (ANALGESIA) 100 MCG/ML EP SOLN
150.0000 ug | Freq: Once | EPIDURAL | Status: AC
  Administered 2017-09-15: 1 mL via INTRA_ARTICULAR
  Filled 2017-09-15: qty 10

## 2017-09-15 MED ORDER — HYDROMORPHONE HCL 1 MG/ML IJ SOLN: 0.2500 mg | INTRAMUSCULAR | Status: DC | PRN

## 2017-09-15 MED ORDER — EPHEDRINE SULFATE 50 MG/ML IJ SOLN
INTRAMUSCULAR | Status: DC | PRN
  Administered 2017-09-15: 5 mg via INTRAVENOUS

## 2017-09-15 MED ORDER — MEPERIDINE HCL 50 MG/ML IJ SOLN: 6.2500 mg | INTRAMUSCULAR | Status: DC | PRN

## 2017-09-15 MED ORDER — DEXAMETHASONE SODIUM PHOSPHATE 10 MG/ML IJ SOLN
INTRAMUSCULAR | Status: DC | PRN
  Administered 2017-09-15: 10 mg via INTRAVENOUS

## 2017-09-15 MED ORDER — MORPHINE SULFATE (PF) 4 MG/ML IV SOLN
INTRAVENOUS | Status: AC
Start: 2017-09-15 — End: ?
  Filled 2017-09-15: qty 2

## 2017-09-15 MED ORDER — HYDROCODONE-ACETAMINOPHEN 7.5-325 MG PO TABS: 1.0000 | ORAL_TABLET | Freq: Once | ORAL | Status: DC | PRN

## 2017-09-15 SURGICAL SUPPLY — 65 items
ALCOHOL 70% 16 OZ (MISCELLANEOUS) ×2 IMPLANT
BANDAGE ACE 3X5.8 VEL STRL LF (GAUZE/BANDAGES/DRESSINGS) ×2 IMPLANT
BANDAGE ACE 4X5 VEL STRL LF (GAUZE/BANDAGES/DRESSINGS) ×2 IMPLANT
BNDG COHESIVE 4X5 TAN STRL (GAUZE/BANDAGES/DRESSINGS) ×2 IMPLANT
BNDG ESMARK 4X9 LF (GAUZE/BANDAGES/DRESSINGS) ×2 IMPLANT
CORDS BIPOLAR (ELECTRODE) ×2 IMPLANT
COVER SURGICAL LIGHT HANDLE (MISCELLANEOUS) ×2 IMPLANT
CUFF TOURNIQUET SINGLE 18IN (TOURNIQUET CUFF) ×2 IMPLANT
CUFF TOURNIQUET SINGLE 24IN (TOURNIQUET CUFF) ×2 IMPLANT
DECANTER SPIKE VIAL GLASS SM (MISCELLANEOUS) IMPLANT
DRAPE INCISE IOBAN 66X45 STRL (DRAPES) ×4 IMPLANT
DRAPE OEC MINIVIEW 54X84 (DRAPES) ×2 IMPLANT
DRSG PAD ABDOMINAL 8X10 ST (GAUZE/BANDAGES/DRESSINGS) ×2 IMPLANT
DRSG TEGADERM 4X4.75 (GAUZE/BANDAGES/DRESSINGS) ×2 IMPLANT
DURAPREP 26ML APPLICATOR (WOUND CARE) ×2 IMPLANT
GAUZE SPONGE 4X4 12PLY STRL (GAUZE/BANDAGES/DRESSINGS) ×2 IMPLANT
GAUZE XEROFORM 1X8 LF (GAUZE/BANDAGES/DRESSINGS) ×2 IMPLANT
GLOVE BIOGEL PI IND STRL 7.5 (GLOVE) ×1 IMPLANT
GLOVE BIOGEL PI IND STRL 8 (GLOVE) ×1 IMPLANT
GLOVE BIOGEL PI INDICATOR 7.5 (GLOVE) ×1
GLOVE BIOGEL PI INDICATOR 8 (GLOVE) ×1
GLOVE ECLIPSE 7.0 STRL STRAW (GLOVE) ×2 IMPLANT
GLOVE SURG ORTHO 8.0 STRL STRW (GLOVE) ×2 IMPLANT
GOWN STRL REUS W/ TWL LRG LVL3 (GOWN DISPOSABLE) ×2 IMPLANT
GOWN STRL REUS W/TWL LRG LVL3 (GOWN DISPOSABLE) ×2
KIT BASIN OR (CUSTOM PROCEDURE TRAY) ×2 IMPLANT
KIT TURNOVER KIT B (KITS) ×2 IMPLANT
LOOP VESSEL MINI RED (MISCELLANEOUS) ×2 IMPLANT
MANIFOLD NEPTUNE II (INSTRUMENTS) IMPLANT
NDL SUT 6 .5 CRC .975X.05 MAYO (NEEDLE) ×1 IMPLANT
NEEDLE 18GX1X1/2 (RX/OR ONLY) (NEEDLE) ×2 IMPLANT
NEEDLE 22X1 1/2 (OR ONLY) (NEEDLE) IMPLANT
NEEDLE MAYO TAPER (NEEDLE) ×1
NS IRRIG 1000ML POUR BTL (IV SOLUTION) ×2 IMPLANT
PACK ORTHO EXTREMITY (CUSTOM PROCEDURE TRAY) ×2 IMPLANT
PAD ARMBOARD 7.5X6 YLW CONV (MISCELLANEOUS) ×4 IMPLANT
PAD CAST 3X4 CTTN HI CHSV (CAST SUPPLIES) ×1 IMPLANT
PAD CAST 4YDX4 CTTN HI CHSV (CAST SUPPLIES) ×1 IMPLANT
PADDING CAST COTTON 3X4 STRL (CAST SUPPLIES) ×1
PADDING CAST COTTON 4X4 STRL (CAST SUPPLIES) ×1
PASSER SUT SWANSON 36MM LOOP (INSTRUMENTS) ×2 IMPLANT
SLING ARM IMMOBILIZER LRG (SOFTGOODS) ×2 IMPLANT
SOL PREP POV-IOD 4OZ 10% (MISCELLANEOUS) ×2 IMPLANT
SPLINT PLASTER CAST XFAST 5X30 (CAST SUPPLIES) ×1 IMPLANT
SPLINT PLASTER XFAST SET 5X30 (CAST SUPPLIES) ×1
STOCKINETTE IMPERVIOUS 9X36 MD (GAUZE/BANDAGES/DRESSINGS) ×2 IMPLANT
STRIP CLOSURE SKIN 1/2X4 (GAUZE/BANDAGES/DRESSINGS) ×2 IMPLANT
SUT MNCRL AB 3-0 PS2 18 (SUTURE) ×2 IMPLANT
SUT MNCRL AB 3-0 PS2 27 (SUTURE) ×2 IMPLANT
SUT SILK 3 0 TIES 17X18 (SUTURE) ×1
SUT SILK 3-0 18XBRD TIE BLK (SUTURE) ×1 IMPLANT
SUT VIC AB 0 CT1 27 (SUTURE) ×1
SUT VIC AB 0 CT1 27XBRD ANBCTR (SUTURE) ×1 IMPLANT
SUT VIC AB 2-0 CT1 27 (SUTURE) ×2
SUT VIC AB 2-0 CT1 TAPERPNT 27 (SUTURE) ×2 IMPLANT
SUTURE TAPE 1.3 FIBERLOP 20 ST (SUTURE) ×1 IMPLANT
SUTURETAPE 1.3 FIBERLOOP 20 ST (SUTURE) ×2
SYR 5ML LL (SYRINGE) ×2 IMPLANT
SYR CONTROL 10ML LL (SYRINGE) IMPLANT
SYSTEM ARTHRO FOR DISTAL BICEP (Orthopedic Implant) ×2 IMPLANT
TOWEL OR 17X24 6PK STRL BLUE (TOWEL DISPOSABLE) ×2 IMPLANT
TOWEL OR 17X26 10 PK STRL BLUE (TOWEL DISPOSABLE) ×2 IMPLANT
TUBE CONNECTING 12X1/4 (SUCTIONS) ×2 IMPLANT
UNDERPAD 30X30 (UNDERPADS AND DIAPERS) ×2 IMPLANT
YANKAUER SUCT BULB TIP NO VENT (SUCTIONS) ×2 IMPLANT

## 2017-09-15 NOTE — H&P (Signed)
[name] [name] is an 57 y.o. male.   Chief Complaint: Left elbow pain HPI: [name] [name] is a 57 year old patient with left elbow pain.  He was hitting her back in 2 weeks ago when he sustained a tearing sensation in his left arm.  He was able to complete the match.  Developed pain weakness and some swelling in the week thereafter.  The ecchymosis localized to the medial aspect of the elbow.  He was examined 2 days ago and found to have weakness in supination and proximal migration of the distal biceps.  He presents now for operative management.  He denies any other orthopedic complaints.  Past Medical History:  Diagnosis Date  . Biceps tendon rupture    left  . H/O seasonal allergies   . Hypercholesterolemia   . OA (osteoarthritis)    BL hips  . Sleep apnea    wears CPAP   . Wears contact lenses     Past Surgical History:  Procedure Laterality Date  . APPENDECTOMY    . COLONOSCOPY    . FOOT SURGERY     left foot  . JOINT REPLACEMENT     B/L hips  . TONSILLECTOMY    . TONSILLECTOMY AND ADENOIDECTOMY    . WISDOM TOOTH EXTRACTION      Family History  Problem Relation Age of Onset  . Alzheimer's disease Mother   . Diabetes Father   . Hypercholesterolemia Father    Social History:  reports that he has never smoked. He has never used smokeless tobacco. He reports that he drinks alcohol. He reports that he does not use drugs.  Allergies:  Allergies  Allergen Reactions  . Lovenox [Enoxaparin] Nausea Only and Other (See Comments)    Flu like symptoms - body pain, fatigue  . Phenergan [Promethazine] Nausea Only and Other (See Comments)    Flu like symptoms - body pain, fatigue  . Versed [Midazolam] Nausea Only and Other (See Comments)    Flu like symptoms - body pain, fatigue    Medications Prior to Admission  Medication Sig Dispense Refill  . meloxicam (MOBIC) 15 MG tablet Take 15 mg by mouth daily as needed for pain.    . multivitamin (ONE-A-DAY MEN'S) TABS tablet Take 1  tablet by mouth daily.    . Omega-3 Fatty Acids (FISH OIL) 1000 MG CAPS Take 1 capsule by mouth daily.    . rosuvastatin (CRESTOR) 10 MG tablet Take 10 mg by mouth daily.    . Testosterone 20 % CREA Apply 1 application topically daily.      No results found for this or any previous visit (from the past 48 hour(s)). No results found.  Review of Systems  Musculoskeletal: Positive for joint pain.  All other systems reviewed and are negative.   Blood pressure (!) 143/92, pulse 66, temperature 98.3 F (36.8 C), temperature source Oral, resp. rate 20, height 6\' 1"  (1.854 m), weight 240 lb (108.9 kg), SpO2 97 %. Physical Exam  Constitutional: He appears well-developed.  HENT:  Head: Normocephalic.  Eyes: Pupils are equal, round, and reactive to light.  Neck: Normal range of motion.  Cardiovascular: Normal rate.  Respiratory: Effort normal.  Neurological: He is alert.  Skin: Skin is warm.  Psychiatric: He has a normal mood and affect.    Examination of the left arm demonstrates proximal migration of the distal biceps.  There is weakness to supination.  Motor sensory function to the hand is intact.  Range of motion is full.  Elbow flexion weakness also is present compared to the right-hand side Assessment/Plan Impression is distal biceps rupture with some retraction.  Plan is distal biceps repair.  Risk and benefits are discussed including but not limited to infection nerve vessel damage heterotopic ossification as well as somewhat prolonged recovery.  Injury is 64 weeks old and should be fixable.  Patient understands all questions answered.  Anderson Malta, MD 09/15/2017, 7:10 AM

## 2017-09-15 NOTE — Anesthesia Procedure Notes (Signed)
Anesthesia Regional Block: Interscalene brachial plexus block   Pre-Anesthetic Checklist: ,, timeout performed, Correct Patient, Correct Site, Correct Laterality, Correct Procedure, Correct Position, site marked, Risks and benefits discussed, at surgeon's request and post-op pain management  Laterality: Upper and Left  Prep: chloraprep       Needles:  Injection technique: Single-shot  Needle Type: Echogenic Needle     Needle Length: 5cm  Needle Gauge: 22     Additional Needles:   Procedures:,,,, ultrasound used (permanent image in chart),,,,  Narrative:  Start time: 09/15/2017 7:08 AM End time: 09/15/2017 7:17 AM  Performed by: Personally  Anesthesiologist: Barnet Glasgow, MD  Additional Notes: Block assessed prior to start of surgery

## 2017-09-15 NOTE — Brief Op Note (Signed)
09/15/2017  10:00 AM  PATIENT:  Larina Earthly III  57 y.o. male  PRE-OPERATIVE DIAGNOSIS:  Left Distal Biceps Rupture  POST-OPERATIVE DIAGNOSIS:  Left Distal Biceps Rupture  PROCEDURE:  Procedure(s): LEFT DISTAL BICEPS TENDON REPAIR  SURGEON:  Surgeon(s): Marlou Sa, Tonna Corner, MD  ASSISTANT: Laure Kidney rnfa  ANESTHESIA:   general  EBL: 15 ml    Total I/O In: 1400 [I.V.:1400] Out: -   BLOOD ADMINISTERED: none  DRAINS: none   LOCAL MEDICATIONS USED:  Marcaine mso4 clonidine  SPECIMEN:  No Specimen  COUNTS:  YES  TOURNIQUET:   Total Tourniquet Time Documented: Upper Arm (Left) - 61 minutes Total: Upper Arm (Left) - 61 minutes   DICTATION: .Other Dictation: Dictation Number ([id number]  PLAN OF CARE: Discharge to home after PACU  PATIENT DISPOSITION:  PACU - hemodynamically stable

## 2017-09-15 NOTE — Anesthesia Postprocedure Evaluation (Signed)
Anesthesia Post Note  Patient: [name] [name]  Procedure(s) Performed: LEFT DISTAL BICEPS TENDON REPAIR (Left Arm Upper)     Patient location during evaluation: PACU Anesthesia Type: Regional and General Level of consciousness: awake and alert Pain management: pain level controlled Vital Signs Assessment: post-procedure vital signs reviewed and stable Respiratory status: spontaneous breathing, nonlabored ventilation, respiratory function stable and patient connected to nasal cannula oxygen Cardiovascular status: blood pressure returned to baseline and stable Postop Assessment: no apparent nausea or vomiting Anesthetic complications: no    Last Vitals:  Vitals:   09/15/17 1115 09/15/17 1145  BP: 110/82 112/76  Pulse: 72 76  Resp: 18   Temp:    SpO2: 97% 97%    Last Pain:  Vitals:   09/15/17 1145  TempSrc:   PainSc: 0-No pain                 Barnet Glasgow

## 2017-09-15 NOTE — Anesthesia Procedure Notes (Signed)
Procedure Name: MAC Date/Time: 09/15/2017 8:03 AM Performed by: Scheryl Darter, CRNA Pre-anesthesia Checklist: Patient identified, Emergency Drugs available, Suction available and Patient being monitored Patient Re-evaluated:Patient Re-evaluated prior to induction Oxygen Delivery Method: Circle System Utilized Preoxygenation: Pre-oxygenation with 100% oxygen Induction Type: IV induction Ventilation: Mask ventilation without difficulty LMA: LMA inserted LMA Size: 5.0 Number of attempts: 1 Airway Equipment and Method: Bite block Placement Confirmation: positive ETCO2 Tube secured with: Tape Dental Injury: Teeth and Oropharynx as per pre-operative assessment

## 2017-09-15 NOTE — Op Note (Signed)
NAME:  [name] [name], [name] [name] NO.:  [id number]  MEDICAL RECORD NO.:  [id number]  LOCATION:                                 FACILITY:  PHYSICIAN:  Anderson Malta, M.D.         DATE OF BIRTH:  DATE OF PROCEDURE:  09/15/2017 DATE OF DISCHARGE:                              OPERATIVE REPORT   PREOPERATIVE DIAGNOSIS:  Left distal biceps rupture.  POSTOPERATIVE DIAGNOSIS:  Left distal biceps rupture.  PROCEDURE:  Left distal biceps rupture repair.  SURGEON:  Anderson Malta, MD.  ASSISTANT:  Laure Kidney, RNFA.  IMPLANTS:  Arthrex Endobutton and 7 x 10 mm interference screw.  INDICATIONS:  [name] is a patient who injured his left distal biceps 2 weeks ago.  He presents for operative management after explanation of risks and benefits.  PROCEDURE IN DETAIL:  The patient was brought to the operating room where general anesthetic was induced.  Preoperative antibiotics were administered.  Time-out was called.  The left arm was prescrubbed with alcohol and Betadine and allowed to air dry, prepped with DuraPrep solution, and draped in a sterile manner.  Charlie Pitter was used to cover the operative field.  Time-out was called.  Arm was elevated and exsanguinated with the Esmarch wrap.  Tourniquet was inflated.  Using fluoroscopic guidance, the radial tuberosity was localized.  A longitudinal incision made from the distal aspect of the radial tuberosity to the elbow flexion crease.  Skin and subcutaneous tissue were sharply divided.  The crossing veins were ligated with silk ligatures.  The terminal sensory branch of the musculocutaneous nerve was identified and kept in its location and protected.  At this time, crossing vessels were dissected free and ligated using silk ties.  This was taken down to the bicipital tuberosity.  At this time, the biceps tendon was tagged with fiber loop suture.  This gave very good fixation on the distal end of the biceps.  Measured about 7 mm in diameter.   The retractors were placed on both the radial and ulnar aspect of the tuberosity.  Care was taken to avoid any injury or retraction to posterior interosseous nerve.  Tunnel was then drilled.  Endobutton fixation was then utilized and the tendon was then dunked into 7.5 mm hole.  Fluoroscopy was used to confirm that the Endobutton was on the back aspect of the radius.  This gave excellent fixation.  Supplemental fixation with interference screw was performed.  This also gave excellent additional fixation and compression of the tendon into the bone tunnel.  At this time, it should be noted that while the bone tunnels being drilled, thorough and copious irrigation was performed in order to remove any bone fragments.  With fixation in good position, elbow was taken through range of motion and found to have good stability.  Tourniquet was released at this time.  Thorough irrigation was again performed.  Skin edges anesthetized using Marcaine, morphine, and clonidine.  All-in-all, about 3 liters of irrigating solution was utilized.  Skin was closed using 2-0 Vicryl and 3-0 Monocryl.  A well-padded posterior splint was applied.  The patient tolerated the procedure well without immediate complications, transferred  to recovery room in stable condition.     Anderson Malta, M.D.   ______________________________ Darnell Level. Alphonzo Severance, M.D.    GSD/MEDQ  D:  09/15/2017  T:  09/15/2017  Job:  [id number]

## 2017-09-15 NOTE — Transfer of Care (Signed)
Immediate Anesthesia Transfer of Care Note  Patient: [name] [name]  Procedure(s) Performed: LEFT DISTAL BICEPS TENDON REPAIR (Left Arm Upper)  Patient Location: PACU    Level of Consciousness: awake, alert , oriented and sedated  Airway & Oxygen Therapy: Patient Spontanous Breathing and Patient connected to nasal cannula oxygen  Post-op Assessment: Report given to RN, Post -op Vital signs reviewed and stable and Patient moving all extremities  Post vital signs: Reviewed and stable  Last Vitals:  Vitals Value Taken Time  BP 123/82 09/15/2017 10:09 AM  Temp    Pulse 69 09/15/2017 10:13 AM  Resp 13 09/15/2017 10:13 AM  SpO2 96 % 09/15/2017 10:13 AM  Vitals shown include unvalidated device data.  Last Pain:  Vitals:   09/15/17 0646  TempSrc:   PainSc: 0-No pain      Patients Stated Pain Goal: 1 (70/34/03 5248)  Complications: No apparent anesthesia complications

## 2017-09-16 ENCOUNTER — Other Ambulatory Visit (INDEPENDENT_AMBULATORY_CARE_PROVIDER_SITE_OTHER): Payer: Self-pay | Admitting: Orthopedic Surgery

## 2017-09-16 MED ORDER — CHLORPROMAZINE HCL 10 MG PO TABS: 10.0000 mg | ORAL_TABLET | Freq: Three times a day (TID) | ORAL | 0 refills | Status: DC | PRN

## 2017-09-16 MED ORDER — HYDROCODONE-ACETAMINOPHEN 5-325 MG PO TABS: 1.0000 | ORAL_TABLET | Freq: Four times a day (QID) | ORAL | 0 refills | Status: DC | PRN

## 2017-09-18 ENCOUNTER — Ambulatory Visit (INDEPENDENT_AMBULATORY_CARE_PROVIDER_SITE_OTHER): Payer: BLUE CROSS/BLUE SHIELD | Admitting: Orthopedic Surgery

## 2017-09-18 ENCOUNTER — Encounter (INDEPENDENT_AMBULATORY_CARE_PROVIDER_SITE_OTHER): Payer: Self-pay | Admitting: Orthopedic Surgery

## 2017-09-18 DIAGNOSIS — S46212A Strain of muscle, fascia and tendon of other parts of biceps, left arm, initial encounter: Secondary | ICD-10-CM

## 2017-09-19 ENCOUNTER — Encounter (INDEPENDENT_AMBULATORY_CARE_PROVIDER_SITE_OTHER): Payer: Self-pay | Admitting: Orthopedic Surgery

## 2017-09-19 NOTE — Progress Notes (Signed)
   Post-Op Visit Note   Patient: [name] [name]           Date of Birth: 1960-08-30           MRN: [id number] Visit Date: 09/18/2017 PCP: Maury Dus, MD   Assessment & Plan:  Chief Complaint:  Chief Complaint  Patient presents with  . Follow-up    post op check left distal bicep   Visit Diagnoses:  1. Rupture of distal biceps tendon, left, initial encounter     Plan: Ed is a patient who is now 3 days out left distal biceps rupture repair.  On examination today the tendon is functional.  Does have some hypersensitivity in the lateral antebrachial nerve distribution.  EPL and wrist extension intact.  Tendon is palpable and functional and the incision is intact.  Biceps contour is normal bilaterally.  Plan at this time is to keep him out of the splint but stay in the sling.  Work on flexion from 90 degrees to full flexion as well as pronation and supination.  I will see him back in a week and will let him start coming out beyond 90 degrees.  Biceps did have some tension at the time of surgery but he is actually stressed it back out since that time.  I will see him back in 7 days for clinical recheck he needs to be ready to travel in 2 weeks for work.  Follow-Up Instructions: No follow-ups on file.   Orders:  No orders of the defined types were placed in this encounter.  No orders of the defined types were placed in this encounter.   Imaging: No results found.  PMFS History: There are no active problems to display for this patient.  Past Medical History:  Diagnosis Date  . Biceps tendon rupture    left  . H/O seasonal allergies   . Hypercholesterolemia   . OA (osteoarthritis)    BL hips  . Sleep apnea    wears CPAP   . Wears contact lenses     Family History  Problem Relation Age of Onset  . Alzheimer's disease Mother   . Diabetes Father   . Hypercholesterolemia Father     Past Surgical History:  Procedure Laterality Date  . APPENDECTOMY    . COLONOSCOPY     . DISTAL BICEPS TENDON REPAIR Left 09/15/2017   Procedure: LEFT DISTAL BICEPS TENDON REPAIR;  Surgeon: Meredith Pel, MD;  Location: [facility];  Service: Orthopedics;  Laterality: Left;  . FOOT SURGERY     left foot  . JOINT REPLACEMENT     B/L hips  . TONSILLECTOMY    . TONSILLECTOMY AND ADENOIDECTOMY    . WISDOM TOOTH EXTRACTION     Social History   Occupational History  . Not on file  Tobacco Use  . Smoking status: Never Smoker  . Smokeless tobacco: Never Used  Substance and Sexual Activity  . Alcohol use: Yes    Comment: social  . Drug use: Never  . Sexual activity: Not on file

## 2017-09-28 ENCOUNTER — Ambulatory Visit (INDEPENDENT_AMBULATORY_CARE_PROVIDER_SITE_OTHER): Payer: BLUE CROSS/BLUE SHIELD | Admitting: Orthopedic Surgery

## 2017-09-28 ENCOUNTER — Encounter (INDEPENDENT_AMBULATORY_CARE_PROVIDER_SITE_OTHER): Payer: Self-pay | Admitting: Orthopedic Surgery

## 2017-09-28 DIAGNOSIS — S46212A Strain of muscle, fascia and tendon of other parts of biceps, left arm, initial encounter: Secondary | ICD-10-CM

## 2017-09-29 ENCOUNTER — Encounter (INDEPENDENT_AMBULATORY_CARE_PROVIDER_SITE_OTHER): Payer: Self-pay | Admitting: Orthopedic Surgery

## 2017-09-29 NOTE — Progress Notes (Signed)
   Post-Op Visit Note   Patient: [name] [name]           Date of Birth: [birth year]           MRN: [id number] Visit Date: 09/28/2017 PCP: Maury Dus, MD   Assessment & Plan:  Chief Complaint:  Chief Complaint  Patient presents with  . Left Elbow - Routine Post Op   Visit Diagnoses:  1. Biceps rupture, distal, left, initial encounter     Plan: Ed is now about 12 days out left distal biceps rupture repair.  He says he is doing well.  I think he has been doing more than what I suggested in terms of doing the arms on the elliptical.  His graft feels intact.  He does have a little swelling underneath the incision but no redness or fluctuance.  I really want him to not do anything with the arm other than just some range of motion exercises but even that he really does not need because he has full range of motion at this time.  Biceps tendon is palpable and intact.  I will check him back in about 3 weeks.  He is to be traveling for the next 2 weeks.  No lifting with that left arm.  He is anxious to get back to playing tennis and that may be possible but without using both hands on the back and for at least another 4 to 6 weeks.  Follow-Up Instructions: Return in about 3 weeks (around 10/19/2017).   Orders:  No orders of the defined types were placed in this encounter.  No orders of the defined types were placed in this encounter.   Imaging: No results found.  PMFS History: There are no active problems to display for this patient.  Past Medical History:  Diagnosis Date  . Biceps tendon rupture    left  . H/O seasonal allergies   . Hypercholesterolemia   . OA (osteoarthritis)    BL hips  . Sleep apnea    wears CPAP   . Wears contact lenses     Family History  Problem Relation Age of Onset  . Alzheimer's disease Mother   . Diabetes Father   . Hypercholesterolemia Father     Past Surgical History:  Procedure Laterality Date  . APPENDECTOMY    . COLONOSCOPY    .  DISTAL BICEPS TENDON REPAIR Left 09/15/2017   Procedure: LEFT DISTAL BICEPS TENDON REPAIR;  Surgeon: Meredith Pel, MD;  Location: [facility];  Service: Orthopedics;  Laterality: Left;  . FOOT SURGERY     left foot  . JOINT REPLACEMENT     B/L hips  . TONSILLECTOMY    . TONSILLECTOMY AND ADENOIDECTOMY    . WISDOM TOOTH EXTRACTION     Social History   Occupational History  . Not on file  Tobacco Use  . Smoking status: Never Smoker  . Smokeless tobacco: Never Used  Substance and Sexual Activity  . Alcohol use: Yes    Comment: social  . Drug use: Never  . Sexual activity: Not on file

## 2017-11-03 DIAGNOSIS — Z0000 Encounter for general adult medical examination without abnormal findings: Secondary | ICD-10-CM | POA: Diagnosis not present

## 2017-11-03 DIAGNOSIS — E291 Testicular hypofunction: Secondary | ICD-10-CM | POA: Diagnosis not present

## 2017-11-03 DIAGNOSIS — E7800 Pure hypercholesterolemia, unspecified: Secondary | ICD-10-CM | POA: Diagnosis not present

## 2017-11-03 DIAGNOSIS — N529 Male erectile dysfunction, unspecified: Secondary | ICD-10-CM | POA: Diagnosis not present

## 2017-11-03 DIAGNOSIS — M2550 Pain in unspecified joint: Secondary | ICD-10-CM | POA: Diagnosis not present

## 2017-11-11 DIAGNOSIS — Z0000 Encounter for general adult medical examination without abnormal findings: Secondary | ICD-10-CM | POA: Diagnosis not present

## 2017-11-11 DIAGNOSIS — E7800 Pure hypercholesterolemia, unspecified: Secondary | ICD-10-CM | POA: Diagnosis not present

## 2017-11-11 DIAGNOSIS — E291 Testicular hypofunction: Secondary | ICD-10-CM | POA: Diagnosis not present

## 2017-12-14 DIAGNOSIS — F4325 Adjustment disorder with mixed disturbance of emotions and conduct: Secondary | ICD-10-CM | POA: Diagnosis not present

## 2017-12-30 DIAGNOSIS — F4325 Adjustment disorder with mixed disturbance of emotions and conduct: Secondary | ICD-10-CM | POA: Diagnosis not present

## 2018-02-16 DIAGNOSIS — F4325 Adjustment disorder with mixed disturbance of emotions and conduct: Secondary | ICD-10-CM | POA: Diagnosis not present

## 2018-02-17 ENCOUNTER — Encounter (INDEPENDENT_AMBULATORY_CARE_PROVIDER_SITE_OTHER): Payer: Self-pay | Admitting: Orthopedic Surgery

## 2018-02-17 ENCOUNTER — Ambulatory Visit (INDEPENDENT_AMBULATORY_CARE_PROVIDER_SITE_OTHER): Payer: Self-pay

## 2018-02-17 ENCOUNTER — Ambulatory Visit (INDEPENDENT_AMBULATORY_CARE_PROVIDER_SITE_OTHER): Payer: BLUE CROSS/BLUE SHIELD | Admitting: Orthopedic Surgery

## 2018-02-17 DIAGNOSIS — M25521 Pain in right elbow: Secondary | ICD-10-CM

## 2018-02-17 MED ORDER — DICLOFENAC SODIUM 2 % TD SOLN: TRANSDERMAL | 1 refills | Status: DC

## 2018-02-17 NOTE — Progress Notes (Signed)
Office Visit Note   Patient: [name] [name]           Date of Birth: Apr 11, 1961           MRN: [id number] Visit Date: 02/17/2018 Requested by: Maury Dus, MD [place], [place] [address] PCP: Maury Dus, MD  Subjective: Chief Complaint  Patient presents with  . Left Elbow - Pain    HPI: [name] [name] is a patient with right elbow pain of 3 weeks duration.  Localizes the pain on the ulnar aspect of the forearm radiating into digits 4 and 5.  He also has a little bit of symptoms around the arcade of  frosh.  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  He is an avid Firefighter.  He states that his hand does not feel weak and he has not had any loss of dexterity.  He has not been doing any weight lifting.  He is on Mobic as needed for symptoms.              ROS: All systems reviewed are negative as they relate to the chief complaint within the history of present illness.  Patient denies  fevers or chills.   Assessment & Plan: Visit Diagnoses:  1. Pain in right elbow     Plan: Impression is right sided elbow pain with relatively acute nature of the pain.  Worse when he plays more tennis.  He has positive Tinel's in the cubital tunnel but no subluxation of the ulnar nerve.  He is having ulnar nerve type symptoms.  He is also having some pain in the mobile wad area which could relate to either occult radial nerve compression or possible early insertional biceps tendinitis pain.  Carpal tunnel is another possibility.  He is having no neck symptoms so radiculopathy less likely.  These had a good result with his distal biceps repair on the left.  Plan at this time is pen said for topical use over that elbow.  Nerve conduction study to evaluate ulnar irritation.  Based on the ossicle on plain radiographs I think it is conceivable that he may have some laxity that ulnar collateral ligament which could be giving him some ulnar  nerve symptoms.  I will see him back after the nerve study and we may need to consider MRI scanning for further evaluation depending on the nerve study.  Follow-Up Instructions: No follow-ups on file.   Orders:  Orders Placed This Encounter  Procedures  . XR Elbow Complete Right (3+View)  . Ambulatory referral to Physical Medicine Rehab   Meds ordered this encounter  Medications  . Diclofenac Sodium (PENNSAID) 2 % SOLN    Sig: 2 squirt to affected area bid prn    Dispense:  1 Bottle    Refill:  1      Procedures: No procedures performed   Clinical Data: No additional findings.  Objective: Vital Signs: There were no vitals taken for this visit.  Physical Exam:   Constitutional: Patient appears well-developed HEENT:  Head: Normocephalic Eyes:EOM are normal Neck: Normal range of motion Cardiovascular: Normal rate Pulmonary/chest: Effort normal Neurologic: Patient is alert Skin: Skin is warm Psychiatric: Patient has normal mood and affect    Ortho Exam: Ortho exam demonstrates full active and passive range of motion of that left elbow.  On the right side he has no subluxation of the ulnar nerve.  Good  motor or sensory function to the hand.  Radial pulses intact.  No real pain with resisted supination on the right.  Does have tenderness at the proximal aspect of the supinator arch.  He has good grip strength with no interosseous wasting or weakness.  He has good neck range of motion.  Specialty Comments:  No specialty comments available.  Imaging: Xr Elbow Complete Right (3+view)  Result Date: 02/17/2018 AP lateral radial head right elbow reviewed.  Mild enthesopathic degenerative changes noted throughout the ulnohumeral joint.  Note is made of curved ossicle at the UCL attachment site on the sublime tubercle.  No loose bodies in the joint.    PMFS History: There are no active problems to display for this patient.  Past Medical History:  Diagnosis Date  .  Biceps tendon rupture    left  . H/O seasonal allergies   . Hypercholesterolemia   . OA (osteoarthritis)    BL hips  . Sleep apnea    wears CPAP   . Wears contact lenses     Family History  Problem Relation Age of Onset  . Alzheimer's disease Mother   . Diabetes Father   . Hypercholesterolemia Father     Past Surgical History:  Procedure Laterality Date  . APPENDECTOMY    . COLONOSCOPY    . DISTAL BICEPS TENDON REPAIR Left 09/15/2017   Procedure: LEFT DISTAL BICEPS TENDON REPAIR;  Surgeon: Meredith Pel, MD;  Location: [facility];  Service: Orthopedics;  Laterality: Left;  . FOOT SURGERY     left foot  . JOINT REPLACEMENT     B/L hips  . TONSILLECTOMY    . TONSILLECTOMY AND ADENOIDECTOMY    . WISDOM TOOTH EXTRACTION     Social History   Occupational History  . Not on file  Tobacco Use  . Smoking status: Never Smoker  . Smokeless tobacco: Never Used  Substance and Sexual Activity  . Alcohol use: Yes    Comment: social  . Drug use: Never  . Sexual activity: Not on file

## 2018-02-18 ENCOUNTER — Telehealth (INDEPENDENT_AMBULATORY_CARE_PROVIDER_SITE_OTHER): Payer: Self-pay | Admitting: *Deleted

## 2018-02-18 NOTE — Telephone Encounter (Signed)
Please see message below from me.

## 2018-02-18 NOTE — Telephone Encounter (Signed)
Tried calling patient. No answer. Will cal back.

## 2018-02-18 NOTE — Telephone Encounter (Signed)
Looks like he rx'd a topical anti-inflammatory "Pennsaid" maybe or "voltaren Gel". If he has more questions direct to Dr. Marlou Sa Staff

## 2018-02-18 NOTE — Telephone Encounter (Signed)
IC s/w patient and advised it was in regards to the Pennsaid.

## 2018-03-02 ENCOUNTER — Telehealth (INDEPENDENT_AMBULATORY_CARE_PROVIDER_SITE_OTHER): Payer: Self-pay | Admitting: Orthopedic Surgery

## 2018-03-02 NOTE — Telephone Encounter (Signed)
IC advised per OV note taking mobic already as needed and provided diagnosis code per OV note.

## 2018-03-02 NOTE — Telephone Encounter (Signed)
Lannette Donath with [facility] called requesting the diagnosis and what other medications has the patient tried.  CB#[phone].  Thank you.

## 2018-03-09 ENCOUNTER — Encounter (INDEPENDENT_AMBULATORY_CARE_PROVIDER_SITE_OTHER): Payer: Self-pay | Admitting: Physical Medicine and Rehabilitation

## 2018-03-09 ENCOUNTER — Ambulatory Visit (INDEPENDENT_AMBULATORY_CARE_PROVIDER_SITE_OTHER): Payer: BLUE CROSS/BLUE SHIELD | Admitting: Physical Medicine and Rehabilitation

## 2018-03-09 DIAGNOSIS — R202 Paresthesia of skin: Secondary | ICD-10-CM

## 2018-03-09 NOTE — Progress Notes (Signed)
 .  Numeric Pain Rating Scale and Functional Assessment Average Pain 4   In the last MONTH (on 0-10 scale) has pain interfered with the following?  1. General activity like being  able to carry out your everyday physical activities such as walking, climbing stairs, carrying groceries, or moving a chair?  Rating(3)   

## 2018-03-11 ENCOUNTER — Ambulatory Visit (INDEPENDENT_AMBULATORY_CARE_PROVIDER_SITE_OTHER): Payer: Self-pay | Admitting: Orthopedic Surgery

## 2018-03-11 ENCOUNTER — Other Ambulatory Visit (INDEPENDENT_AMBULATORY_CARE_PROVIDER_SITE_OTHER): Payer: Self-pay | Admitting: Orthopedic Surgery

## 2018-03-11 NOTE — Progress Notes (Signed)
Larina Earthly III - 57 y.o. male MRN [id number]  Date of birth: May 04, 1961  Office Visit Note: Visit Date: 03/09/2018 PCP: Maury Dus, MD Referred by: Maury Dus, MD  Subjective: Chief Complaint  Patient presents with  . Right Forearm - Pain  . Right Hand - Numbness   HPI: Medardo Hassing is a 57 year old right-hand-dominant gentleman who comes in today at the request of Dr. Anderson Malta for electrodiagnostic study of the right upper limb.  Patient reports starting in September of this year he started having symptoms of the right elbow and forearm pain which is dorsal though some referral and numbness into the right hand particularly the right ring finger.  He reports playing tennis makes the symptoms worse.  He has not played for 3 weeks or so he has felt like it is reduced his symptoms quite a bit.  He reports nocturnal complaints with the numbness getting worse at night.  He also reports worsening symptoms with activities during the day such as driving and holding a phone.  He has not noted any left-sided symptoms.  No frank radicular symptoms.  He has had no prior electrodiagnostic studies.   ROS Otherwise per HPI.  Assessment & Plan: Visit Diagnoses:  1. Paresthesia of skin     Plan: Impression: The above electrodiagnostic study is ABNORMAL and reveals evidence of a moderate right median nerve entrapment at the wrist (carpal tunnel syndrome) affecting sensory and motor components.   There is no significant electrodiagnostic evidence of any other focal nerve entrapment, brachial plexopathy or cervical radiculopathy.   Recommendations: 1.  Follow-up with referring physician. 2.  Continue current management of symptoms. 3.  Continue use of resting splint at night-time and as needed during the day, consider release.   Meds & Orders: No orders of the defined types were placed in this encounter.   Orders Placed This Encounter  Procedures  . NCV with EMG (electromyography)      Follow-up: Return for  Anderson Malta, M.D..   Procedures: No procedures performed  EMG & NCV Findings: Evaluation of the right median motor nerve showed prolonged distal onset latency (4.6 ms) and decreased conduction velocity (Elbow-Wrist, 46 m/s).  The right median (across palm) sensory nerve showed prolonged distal peak latency (Wrist, 4.7 ms) and prolonged distal peak latency (Palm, 2.1 ms).  All remaining nerves (as indicated in the following tables) were within normal limits.    All examined muscles (as indicated in the following table) showed no evidence of electrical instability.    Impression: The above electrodiagnostic study is ABNORMAL and reveals evidence of a moderate right median nerve entrapment at the wrist (carpal tunnel syndrome) affecting sensory and motor components.   There is no significant electrodiagnostic evidence of any other focal nerve entrapment, brachial plexopathy or cervical radiculopathy.   Recommendations: 1.  Follow-up with referring physician. 2.  Continue current management of symptoms. 3.  Continue use of resting splint at night-time and as needed during the day, consider release.  ___________________________ Wonda Olds Board Certified, American Board of Physical Medicine and Rehabilitation    Nerve Conduction Studies Anti Sensory Summary Table   Stim Site NR Peak (ms) Norm Peak (ms) P-T Amp (V) Norm P-T Amp Site1 Site2 Delta-P (ms) Dist (cm) Vel (m/s) Norm Vel (m/s)  Right Median Acr Palm Anti Sensory (2nd Digit)  32.5C  Wrist    *4.7 <3.6 18.5 >10 Wrist Palm 2.6 0.0    Palm    *2.1 <  2.0 8.1         Right Radial Anti Sensory (Base 1st Digit)  31.6C  Wrist    2.2 <3.1 15.7  Wrist Base 1st Digit 2.2 0.0    Right Ulnar Anti Sensory (5th Digit)  32C  Wrist    3.5 <3.7 17.7 >15.0 Wrist 5th Digit 3.5 14.0 40 >38   Motor Summary Table   Stim Site NR Onset (ms) Norm Onset (ms) O-P Amp (mV) Norm O-P Amp Site1 Site2 Delta-0 (ms) Dist  (cm) Vel (m/s) Norm Vel (m/s)  Right Median Motor (Abd Poll Brev)  31.6C  Wrist    *4.6 <4.2 5.6 >5 Elbow Wrist 5.0 23.2 *46 >50  Elbow    9.6  5.1         Right Ulnar Motor (Abd Dig Min)  31.5C  Wrist    2.8 <4.2 9.3 >3 B Elbow Wrist 4.5 24.0 53 >53  B Elbow    7.3  6.9  A Elbow B Elbow 1.7 11.0 65 >53  A Elbow    9.0  8.0          EMG   Side Muscle Nerve Root Ins Act Fibs Psw Amp Dur Poly Recrt Int Fraser Din Comment  Right Abd Poll Brev Median C8-T1 Nml Nml Nml Nml Nml 0 Nml Nml   Right 1stDorInt Ulnar C8-T1 Nml Nml Nml Nml Nml 0 Nml Nml   Right PronatorTeres Median C6-7 Nml Nml Nml Nml Nml 0 Nml Nml   Right Biceps Musculocut C5-6 Nml Nml Nml Nml Nml 0 Nml Nml   Right Deltoid Axillary C5-6 Nml Nml Nml Nml Nml 0 Nml Nml     Nerve Conduction Studies Anti Sensory Left/Right Comparison   Stim Site L Lat (ms) R Lat (ms) L-R Lat (ms) L Amp (V) R Amp (V) L-R Amp (%) Site1 Site2 L Vel (m/s) R Vel (m/s) L-R Vel (m/s)  Median Acr Palm Anti Sensory (2nd Digit)  32.5C  Wrist  *4.7   18.5  Wrist Palm     Palm  *2.1   8.1        Radial Anti Sensory (Base 1st Digit)  31.6C  Wrist  2.2   15.7  Wrist Base 1st Digit     Ulnar Anti Sensory (5th Digit)  32C  Wrist  3.5   17.7  Wrist 5th Digit  40    Motor Left/Right Comparison   Stim Site L Lat (ms) R Lat (ms) L-R Lat (ms) L Amp (mV) R Amp (mV) L-R Amp (%) Site1 Site2 L Vel (m/s) R Vel (m/s) L-R Vel (m/s)  Median Motor (Abd Poll Brev)  31.6C  Wrist  *4.6   5.6  Elbow Wrist  *46   Elbow  9.6   5.1        Ulnar Motor (Abd Dig Min)  31.5C  Wrist  2.8   9.3  B Elbow Wrist  53   B Elbow  7.3   6.9  A Elbow B Elbow  65   A Elbow  9.0   8.0           Waveforms:            Clinical History: No specialty comments available.   He reports that he has never smoked. He has never used smokeless tobacco. No results for input(s): HGBA1C, LABURIC in the last 8760 hours.  Objective:  VS:  HT:    WT:   BMI:     BP:  HR: bpm  TEMP: ( )   RESP:  Physical Exam  Musculoskeletal:  Inspection reveals no atrophy of the bilateral APB or FDI or hand intrinsics. There is no swelling, color changes, allodynia or dystrophic changes. There is 5 out of 5 strength in the bilateral wrist extension, finger abduction and long finger flexion. There is intact sensation to light touch in all dermatomal and peripheral nerve distributions.  There is a negative Tinel's test at the bilateral wrist and elbow.  He does have some pain over the extensor wad on the right.  There is a negative Hoffmann's test bilaterally.    Ortho Exam Imaging: No results found.  Past Medical/Family/Surgical/Social History: Medications & Allergies reviewed per EMR, new medications updated. There are no active problems to display for this patient.  Past Medical History:  Diagnosis Date  . Biceps tendon rupture    left  . H/O seasonal allergies   . Hypercholesterolemia   . OA (osteoarthritis)    BL hips  . Sleep apnea    wears CPAP   . Wears contact lenses    Family History  Problem Relation Age of Onset  . Alzheimer's disease Mother   . Diabetes Father   . Hypercholesterolemia Father    Past Surgical History:  Procedure Laterality Date  . APPENDECTOMY    . COLONOSCOPY    . DISTAL BICEPS TENDON REPAIR Left 09/15/2017   Procedure: LEFT DISTAL BICEPS TENDON REPAIR;  Surgeon: Meredith Pel, MD;  Location: [facility];  Service: Orthopedics;  Laterality: Left;  . FOOT SURGERY     left foot  . JOINT REPLACEMENT     B/L hips  . TONSILLECTOMY    . TONSILLECTOMY AND ADENOIDECTOMY    . WISDOM TOOTH EXTRACTION     Social History   Occupational History  . Not on file  Tobacco Use  . Smoking status: Never Smoker  . Smokeless tobacco: Never Used  Substance and Sexual Activity  . Alcohol use: Yes    Comment: social  . Drug use: Never  . Sexual activity: Not on file

## 2018-03-11 NOTE — Progress Notes (Signed)
I talked with dad on the phone.  He has moderate carpal tunnel syndrome on the right hand.  I talked with him at length.  I think that he needs to try several things before considering any type of invasive intervention.  #1 is changing the grip on his racquet.  #2 is using softer strings.  #3 is using a cock-up wrist splint at night.  The fourth thing would be using a compressive sleeve during the day if he needs it.  I am going to write him a prescription for both that cock-up wrist splint as well as a compression sleeve.  We will see him back for diagnostic and therapeutic injection into the carpal canal if his symptoms warrant.  In discussing with him over the phone his symptoms he actually did well taking 3 weeks off.  We will see how he does with these interventions.

## 2018-03-12 ENCOUNTER — Telehealth (INDEPENDENT_AMBULATORY_CARE_PROVIDER_SITE_OTHER): Payer: Self-pay | Admitting: Radiology

## 2018-03-12 NOTE — Telephone Encounter (Signed)
Dr. Marlou Sa would like patient to have right volar wrist splint. He states order can be written for [facility] or patient can pick up here, whichever he prefers. I left voicemail for patient advising and asked for return call in regards to which he would like to do.

## 2018-03-15 DIAGNOSIS — G5601 Carpal tunnel syndrome, right upper limb: Secondary | ICD-10-CM | POA: Diagnosis not present

## 2018-03-15 NOTE — Procedures (Signed)
EMG & NCV Findings: Evaluation of the right median motor nerve showed prolonged distal onset latency (4.6 ms) and decreased conduction velocity (Elbow-Wrist, 46 m/s).  The right median (across palm) sensory nerve showed prolonged distal peak latency (Wrist, 4.7 ms) and prolonged distal peak latency (Palm, 2.1 ms).  All remaining nerves (as indicated in the following tables) were within normal limits.    All examined muscles (as indicated in the following table) showed no evidence of electrical instability.    Impression: The above electrodiagnostic study is ABNORMAL and reveals evidence of a moderate right median nerve entrapment at the wrist (carpal tunnel syndrome) affecting sensory and motor components.   There is no significant electrodiagnostic evidence of any other focal nerve entrapment, brachial plexopathy or cervical radiculopathy.   Recommendations: 1.  Follow-up with referring physician. 2.  Continue current management of symptoms. 3.  Continue use of resting splint at night-time and as needed during the day, consider release.  ___________________________ Wonda Olds Board Certified, American Board of Physical Medicine and Rehabilitation    Nerve Conduction Studies Anti Sensory Summary Table   Stim Site NR Peak (ms) Norm Peak (ms) P-T Amp (V) Norm P-T Amp Site1 Site2 Delta-P (ms) Dist (cm) Vel (m/s) Norm Vel (m/s)  Right Median Acr Palm Anti Sensory (2nd Digit)  32.5C  Wrist    *4.7 <3.6 18.5 >10 Wrist Palm 2.6 0.0    Palm    *2.1 <2.0 8.1         Right Radial Anti Sensory (Base 1st Digit)  31.6C  Wrist    2.2 <3.1 15.7  Wrist Base 1st Digit 2.2 0.0    Right Ulnar Anti Sensory (5th Digit)  32C  Wrist    3.5 <3.7 17.7 >15.0 Wrist 5th Digit 3.5 14.0 40 >38   Motor Summary Table   Stim Site NR Onset (ms) Norm Onset (ms) O-P Amp (mV) Norm O-P Amp Site1 Site2 Delta-0 (ms) Dist (cm) Vel (m/s) Norm Vel (m/s)  Right Median Motor (Abd Poll Brev)  31.6C  Wrist     *4.6 <4.2 5.6 >5 Elbow Wrist 5.0 23.2 *46 >50  Elbow    9.6  5.1         Right Ulnar Motor (Abd Dig Min)  31.5C  Wrist    2.8 <4.2 9.3 >3 B Elbow Wrist 4.5 24.0 53 >53  B Elbow    7.3  6.9  A Elbow B Elbow 1.7 11.0 65 >53  A Elbow    9.0  8.0          EMG   Side Muscle Nerve Root Ins Act Fibs Psw Amp Dur Poly Recrt Int Fraser Din Comment  Right Abd Poll Brev Median C8-T1 Nml Nml Nml Nml Nml 0 Nml Nml   Right 1stDorInt Ulnar C8-T1 Nml Nml Nml Nml Nml 0 Nml Nml   Right PronatorTeres Median C6-7 Nml Nml Nml Nml Nml 0 Nml Nml   Right Biceps Musculocut C5-6 Nml Nml Nml Nml Nml 0 Nml Nml   Right Deltoid Axillary C5-6 Nml Nml Nml Nml Nml 0 Nml Nml     Nerve Conduction Studies Anti Sensory Left/Right Comparison   Stim Site L Lat (ms) R Lat (ms) L-R Lat (ms) L Amp (V) R Amp (V) L-R Amp (%) Site1 Site2 L Vel (m/s) R Vel (m/s) L-R Vel (m/s)  Median Acr Palm Anti Sensory (2nd Digit)  32.5C  Wrist  *4.7   18.5  Wrist Palm     Palm  *  2.1   8.1        Radial Anti Sensory (Base 1st Digit)  31.6C  Wrist  2.2   15.7  Wrist Base 1st Digit     Ulnar Anti Sensory (5th Digit)  32C  Wrist  3.5   17.7  Wrist 5th Digit  40    Motor Left/Right Comparison   Stim Site L Lat (ms) R Lat (ms) L-R Lat (ms) L Amp (mV) R Amp (mV) L-R Amp (%) Site1 Site2 L Vel (m/s) R Vel (m/s) L-R Vel (m/s)  Median Motor (Abd Poll Brev)  31.6C  Wrist  *4.6   5.6  Elbow Wrist  *46   Elbow  9.6   5.1        Ulnar Motor (Abd Dig Min)  31.5C  Wrist  2.8   9.3  B Elbow Wrist  53   B Elbow  7.3   6.9  A Elbow B Elbow  65   A Elbow  9.0   8.0           Waveforms:

## 2018-03-16 ENCOUNTER — Ambulatory Visit: Payer: Self-pay | Admitting: Psychiatry

## 2018-05-04 DIAGNOSIS — C4491 Basal cell carcinoma of skin, unspecified: Secondary | ICD-10-CM

## 2018-05-04 DIAGNOSIS — D485 Neoplasm of uncertain behavior of skin: Secondary | ICD-10-CM | POA: Diagnosis not present

## 2018-05-04 DIAGNOSIS — C44712 Basal cell carcinoma of skin of right lower limb, including hip: Secondary | ICD-10-CM | POA: Diagnosis not present

## 2018-05-04 HISTORY — DX: Basal cell carcinoma of skin, unspecified: C44.91

## 2018-06-08 DIAGNOSIS — C44712 Basal cell carcinoma of skin of right lower limb, including hip: Secondary | ICD-10-CM | POA: Diagnosis not present

## 2018-06-08 DIAGNOSIS — Z7982 Long term (current) use of aspirin: Secondary | ICD-10-CM | POA: Diagnosis not present

## 2018-06-08 DIAGNOSIS — Z85828 Personal history of other malignant neoplasm of skin: Secondary | ICD-10-CM | POA: Diagnosis not present

## 2018-07-28 DIAGNOSIS — C44712 Basal cell carcinoma of skin of right lower limb, including hip: Secondary | ICD-10-CM | POA: Diagnosis not present

## 2018-07-28 DIAGNOSIS — Z483 Aftercare following surgery for neoplasm: Secondary | ICD-10-CM | POA: Diagnosis not present

## 2018-08-11 ENCOUNTER — Ambulatory Visit (INDEPENDENT_AMBULATORY_CARE_PROVIDER_SITE_OTHER): Payer: BLUE CROSS/BLUE SHIELD | Admitting: Psychiatry

## 2018-08-11 DIAGNOSIS — F5102 Adjustment insomnia: Secondary | ICD-10-CM | POA: Diagnosis not present

## 2018-08-11 DIAGNOSIS — Z630 Problems in relationship with spouse or partner: Secondary | ICD-10-CM

## 2018-08-11 DIAGNOSIS — F411 Generalized anxiety disorder: Secondary | ICD-10-CM | POA: Diagnosis not present

## 2018-08-11 NOTE — Progress Notes (Addendum)
Psychotherapy Progress Note [facility] Luan Moore, PhD LP  Patient ID: [name] [name]     MRN: [id number]     Therapy format: Individual psychotherapy Date: 08/11/2018     Start: 3:18p Stop: 4:05p Time Spent: 47 min  Session narrative -- presenting needs, interim history, self-report of stressors and symptoms, applications of prior therapy, status changes, and interventions made in session Returning to therapy after about 6 months, following heavy travel season.  Wants help creating and safeguarding intimate time with wife [name] [name] (in all senses).   Sees both of them wiped out by time demands, job (his) and family (hers) responsibilities.  Wife is "the [employer]" of her family.  Neither of them grouchy, but consciously, knowingly concerned about the rut they are in and how overextended they get serving one purpose after another.    Recommended safeguarded time in calendar, brief relaxers for each.  Discussed Marriage Encounter or any workshop of choice for a concentrated retreat together.  For stress relief, taught 1-minute mindful listening and oriented to use as a GAD reducer.    Continues to take melatonin for sleep readiness, but includes a 3am re-dose.  Discussed likelihood that dosing fully 1 hr before bed would be optimal for inducing and holding sleep.  Advised to check dosages and keep 10mg  or less.  Also recommended use of light controls to encourage more natural melatonin response.  Continues MCT capsule in the morning to support intermittent carb fasting (9:30pm-noon).  Habit seems stable, useful.  Therapeutic modalities: Cognitive Behavioral Therapy and Psycho-education/Bibliotherapy  Mental Status/Observations:  Appearance:   Casual     Behavior:  Appropriate  Motor:  Normal  Speech/Language:   Clear and Coherent  Affect:  Appropriate  Mood:  responsive  Thought process:  normal and some tendency to go to extreme on self-help  Thought  content:    WNL  Sensory/Perceptual disturbances:    WNL  Orientation:  WNL  Attention:  Good  Concentration:  Good  Memory:  WNL  Insight:    Good  Judgment:   Good  Impulse Control:  Good   Risk Assessment: Danger to Self:  No Self-injurious Behavior: No Danger to Others: No Duty to Warn:no Physical Aggression / Violence:No  Access to Firearms a concern: No   Diagnosis:   ICD-10-CM   1. Generalized anxiety disorder F41.1   2. Relationship problem between partners Z63.0   3. Insomnia due to psychological stress F51.02     Assessment of progress:  improving  Plan:  . Check dosage and regulate melatonin for 1 dose only.   . Continue use of MCT and carb fasting . Option to marriage retreat.  Short of that, seek undivided time, before too tired, a couple days a week . Continue to utilize previously learned skills ad lib . Maintain medication as prescribed and work faithfully with relevant prescriber(s) if any changes are desired or seem indicated . Call the clinic on-call service, present to ER, or call 911 if any life-threatening emergency Return in about 2 weeks (around 08/25/2018).   [name] Serve, PhD  Licensed Psychologist  Addendum, 12/27/18 Notified by office manager that PT did not recall having this appointment, called billing dept., who told him they could not see an appointment on the [facility] calendar, PT led to believe it may have been a fraudulent or erroneous claim.  Turns out, Billing Dept. unable to view Restricted notes, possibly misspoken about not being on the "schedule" (or else  a Administrator, Civil Service that hides behavioral health sessions from view?).  TC to PT jogged his memory for this meeting.  - RAM

## 2018-08-31 ENCOUNTER — Ambulatory Visit: Payer: BLUE CROSS/BLUE SHIELD | Admitting: Psychiatry

## 2018-11-10 DIAGNOSIS — Z0000 Encounter for general adult medical examination without abnormal findings: Secondary | ICD-10-CM | POA: Diagnosis not present

## 2018-11-10 DIAGNOSIS — N529 Male erectile dysfunction, unspecified: Secondary | ICD-10-CM | POA: Diagnosis not present

## 2018-11-10 DIAGNOSIS — E7800 Pure hypercholesterolemia, unspecified: Secondary | ICD-10-CM | POA: Diagnosis not present

## 2018-11-10 DIAGNOSIS — M2550 Pain in unspecified joint: Secondary | ICD-10-CM | POA: Diagnosis not present

## 2018-11-10 DIAGNOSIS — E291 Testicular hypofunction: Secondary | ICD-10-CM | POA: Diagnosis not present

## 2018-11-23 ENCOUNTER — Ambulatory Visit (INDEPENDENT_AMBULATORY_CARE_PROVIDER_SITE_OTHER): Payer: BC Managed Care – PPO

## 2018-11-23 ENCOUNTER — Ambulatory Visit (INDEPENDENT_AMBULATORY_CARE_PROVIDER_SITE_OTHER): Payer: BC Managed Care – PPO | Admitting: Podiatry

## 2018-11-23 ENCOUNTER — Other Ambulatory Visit: Payer: Self-pay

## 2018-11-23 ENCOUNTER — Encounter: Payer: Self-pay | Admitting: Podiatry

## 2018-11-23 ENCOUNTER — Other Ambulatory Visit: Payer: Self-pay | Admitting: Podiatry

## 2018-11-23 VITALS — Temp 97.3°F

## 2018-11-23 DIAGNOSIS — L601 Onycholysis: Secondary | ICD-10-CM | POA: Diagnosis not present

## 2018-11-23 DIAGNOSIS — S90424A Blister (nonthermal), right lesser toe(s), initial encounter: Secondary | ICD-10-CM | POA: Diagnosis not present

## 2018-11-23 DIAGNOSIS — S99921A Unspecified injury of right foot, initial encounter: Secondary | ICD-10-CM

## 2018-11-23 DIAGNOSIS — B351 Tinea unguium: Secondary | ICD-10-CM

## 2018-11-23 DIAGNOSIS — M21619 Bunion of unspecified foot: Secondary | ICD-10-CM | POA: Diagnosis not present

## 2018-11-23 DIAGNOSIS — M779 Enthesopathy, unspecified: Secondary | ICD-10-CM | POA: Diagnosis not present

## 2018-11-23 MED ORDER — CEPHALEXIN 500 MG PO CAPS: 500.0000 mg | ORAL_CAPSULE | Freq: Three times a day (TID) | ORAL | 0 refills | Status: DC

## 2018-11-23 NOTE — Patient Instructions (Signed)

## 2018-11-29 NOTE — Progress Notes (Signed)
Subjective:   Patient ID: [name] [name] III, male   DOB: 58 y.o.   MRN: [id number]   HPI 58 year old male presents the office today for concerns of pain to his right third toe.  He states that he has noticed a blister and blood around the nail.  He states he was playing tennis when he injured his toe.  He denies any recent injury otherwise.  He also has bunions toe issues which is been ongoing for some time.   Review of Systems  All other systems reviewed and are negative.  Past Medical History:  Diagnosis Date  . Biceps tendon rupture    left  . H/O seasonal allergies   . Hypercholesterolemia   . OA (osteoarthritis)    BL hips  . Sleep apnea    wears CPAP   . Wears contact lenses     Past Surgical History:  Procedure Laterality Date  . APPENDECTOMY    . COLONOSCOPY    . DISTAL BICEPS TENDON REPAIR Left 09/15/2017   Procedure: LEFT DISTAL BICEPS TENDON REPAIR;  Surgeon: Meredith Pel, MD;  Location: [place];  Service: Orthopedics;  Laterality: Left;  . FOOT SURGERY     left foot  . JOINT REPLACEMENT     B/L hips  . TONSILLECTOMY    . TONSILLECTOMY AND ADENOIDECTOMY    . WISDOM TOOTH EXTRACTION       Current Outpatient Medications:  .  cephALEXin (KEFLEX) 500 MG capsule, Take 1 capsule (500 mg total) by mouth 3 (three) times daily., Disp: 21 capsule, Rfl: 0 .  rosuvastatin (CRESTOR) 10 MG tablet, Take 10 mg by mouth daily., Disp: , Rfl:  .  Testosterone 20 % CREA, Apply 1 application topically daily., Disp: , Rfl:   Allergies  Allergen Reactions  . Lovenox [Enoxaparin] Nausea Only and Other (See Comments)    Flu like symptoms - body pain, fatigue  . Phenergan [Promethazine] Nausea Only and Other (See Comments)    Flu like symptoms - body pain, fatigue  . Versed [Midazolam] Nausea Only and Other (See Comments)    Flu like symptoms - body pain, fatigue        Objective:  Physical Exam  General: AAO x3, NAD  Dermatological: The right third digit toenails  more hypertrophic and dystrophic than the other toenails and there is a blister around the nailbed and the proximal aspect.  Significant erythema is noted a significant redness.  No fluctuation crepitation.  In general his nails are hypertrophic, dystrophic and discolored.  No open lesions otherwise.  Vascular: Dorsalis Pedis artery and Posterior Tibial artery pedal pulses are 2/4 bilateral with immedate capillary fill time. Pedal hair growth present. No varicosities and no lower extremity edema present bilateral. There is no pain with calf compression, swelling, warmth, erythema.   Neruologic: Grossly intact via light touch bilateral. Vibratory intact via tuning fork bilateral. Protective threshold with Semmes Wienstein monofilament intact to all pedal sites bilateral.    Musculoskeletal: Bunion deformities are present with hammertoe contractures present.  Muscular strength 5/5 in all groups tested bilateral.  Gait: Unassisted, Nonantalgic.      Assessment:   Right hallux onychodystrophy, blister; regular vomiting, hammertoes    Plan:  -Treatment options discussed including all alternatives, risks, and complications -Etiology of symptoms were discussed -X-rays were obtained and reviewed with the patient.  Bony deformities noted with hammertoe contractures.  No evidence of acute fracture. -At this time, recommended total nail removal without chemical matricectomy to the  right third digit toenail due to blister and the nail becoming loose. Risks and complications were discussed with the patient for which they understand and  verbally consent to the procedure. Under sterile conditions a total of 3 mL of a mixture of 2% lidocaine plain and 0.5% Marcaine plain was infiltrated in a digital block fashion. Once anesthetized, the skin was prepped in sterile fashion. A tourniquet was then applied. Next the right third digit nail was sharply excised making sure to remove the entire offending nail border.  Once the nail was  Removed, the area was debrided and the underlying skin was intact. The area was irrigated and hemostasis was obtained.  A dry sterile dressing was applied. After application of the dressing the tourniquet was removed and there is found to be an immediate capillary refill time to the digit. The patient tolerated the procedure well any complications. Post procedure instructions were discussed the patient for which he verbally understood. Follow-up in one week for nail check or sooner if any problems are to arise. Discussed signs/symptoms of worsening infection and directed to call the office immediately should any occur or go directly to the emergency room. In the meantime, encouraged to call the office with any questions, concerns, changes symptoms. -Nail sent for culture   Trula Slade DPM

## 2018-11-30 ENCOUNTER — Ambulatory Visit (INDEPENDENT_AMBULATORY_CARE_PROVIDER_SITE_OTHER): Payer: BC Managed Care – PPO | Admitting: Podiatry

## 2018-11-30 ENCOUNTER — Encounter: Payer: Self-pay | Admitting: Podiatry

## 2018-11-30 ENCOUNTER — Other Ambulatory Visit: Payer: Self-pay

## 2018-11-30 VITALS — Temp 96.6°F

## 2018-11-30 DIAGNOSIS — L601 Onycholysis: Secondary | ICD-10-CM

## 2018-11-30 NOTE — Patient Instructions (Signed)

## 2018-12-01 NOTE — Progress Notes (Signed)
Subjective: [name] [name] is a 58 y.o. male returns to office today for follow up evaluation after having right third digit total nail avulsion performed. Patient has been soaking using Epsom salts and applying topical antibiotic covered with bandaid daily.  He states he is ready for the first couple days with his is resolved and the drainage is stopped.  No pain, drainage of pus or any redness or red streaks at this time.  Patient denies fevers, chills, nausea, vomiting. Denies any calf pain, chest pain, SOB.   Objective:  Vitals: Reviewed  General: Well developed, nourished, in no acute distress, alert and oriented x3   Dermatology: Skin is warm, dry and supple bilateral.  Right third digit nail bed appears to be clean, dry, with mild granular tissue and surrounding scab. There is no surrounding erythema, edema, drainage/purulence. The remaining nails appear unremarkable at this time. There are no other lesions or other signs of infection present.  Neurovascular status: Intact. No lower extremity swelling; No pain with calf compression bilateral.  Musculoskeletal: No tenderness to palpation of the nail bed. Muscular strength within normal limits bilateral.   Assesement and Plan: S/p partial nail avulsion, doing well.   -Continue soaking in epsom salts twice a day followed by antibiotic ointment and a band-aid. Can leave uncovered at night. Continue this until completely healed.  -If the area has not healed in 2 weeks, call the office for follow-up appointment, or sooner if any problems arise.  -Monitor for any signs/symptoms of infection. Call the office immediately if any occur or go directly to the emergency room. Call with any questions/concerns. -Awaiting nail culture results  Celesta Gentile, DPM

## 2018-12-08 ENCOUNTER — Encounter: Payer: Self-pay | Admitting: Podiatry

## 2018-12-08 DIAGNOSIS — E291 Testicular hypofunction: Secondary | ICD-10-CM | POA: Diagnosis not present

## 2018-12-08 DIAGNOSIS — E7800 Pure hypercholesterolemia, unspecified: Secondary | ICD-10-CM | POA: Diagnosis not present

## 2018-12-08 DIAGNOSIS — Z125 Encounter for screening for malignant neoplasm of prostate: Secondary | ICD-10-CM | POA: Diagnosis not present

## 2018-12-08 DIAGNOSIS — R739 Hyperglycemia, unspecified: Secondary | ICD-10-CM | POA: Diagnosis not present

## 2018-12-09 NOTE — Progress Notes (Signed)
Sent the message over to Dr Jacqualyn Posey. Lattie Haw

## 2018-12-14 NOTE — Progress Notes (Signed)
I called on Monday July 6th and left a message for the medical records to call me back and I am calling again today on July 7th to get the results of the blood work that the PCP did so the patient can go ahead and start the lamisil but no one from the doctors office has called me back (Dr Alyson Ingles at [phone]). [name] [name]

## 2018-12-16 ENCOUNTER — Telehealth: Payer: Self-pay | Admitting: *Deleted

## 2018-12-16 MED ORDER — TERBINAFINE HCL 250 MG PO TABS: 250.0000 mg | ORAL_TABLET | Freq: Every day | ORAL | 0 refills | Status: DC

## 2018-12-16 NOTE — Telephone Encounter (Signed)
Called and spoke with the medical records department today and I asked to get the blood work for the patient and they faxed over to [phone]. Lattie Haw

## 2018-12-16 NOTE — Telephone Encounter (Signed)
Called and spoke with the patient to let him know that the blood work that was done at the PCP doctor came back and Dr March Rummage looked at it (Dr Jacqualyn Posey out of the office) and stated that the blood work was normal and patient could start the lamisil in which I send over to the pharmacy 30 tablets and no refills and patient needs a one month appointment. Lattie Haw

## 2018-12-16 NOTE — Addendum Note (Signed)
Addended by: Cranford Mon R on: 12/16/2018 03:01 PM   Modules accepted: Orders

## 2019-02-22 DIAGNOSIS — L821 Other seborrheic keratosis: Secondary | ICD-10-CM | POA: Diagnosis not present

## 2019-02-22 DIAGNOSIS — L710 Perioral dermatitis: Secondary | ICD-10-CM | POA: Diagnosis not present

## 2019-02-22 DIAGNOSIS — D229 Melanocytic nevi, unspecified: Secondary | ICD-10-CM | POA: Diagnosis not present

## 2019-03-02 DIAGNOSIS — G4733 Obstructive sleep apnea (adult) (pediatric): Secondary | ICD-10-CM | POA: Diagnosis not present

## 2019-03-17 DIAGNOSIS — Z23 Encounter for immunization: Secondary | ICD-10-CM | POA: Diagnosis not present

## 2019-09-21 ENCOUNTER — Other Ambulatory Visit: Payer: Self-pay

## 2019-09-21 ENCOUNTER — Encounter (INDEPENDENT_AMBULATORY_CARE_PROVIDER_SITE_OTHER): Payer: Self-pay

## 2019-09-21 ENCOUNTER — Ambulatory Visit (INDEPENDENT_AMBULATORY_CARE_PROVIDER_SITE_OTHER): Payer: BC Managed Care – PPO | Admitting: Dermatology

## 2019-09-21 DIAGNOSIS — C44712 Basal cell carcinoma of skin of right lower limb, including hip: Secondary | ICD-10-CM | POA: Diagnosis not present

## 2019-09-21 DIAGNOSIS — L719 Rosacea, unspecified: Secondary | ICD-10-CM | POA: Diagnosis not present

## 2019-09-21 DIAGNOSIS — L570 Actinic keratosis: Secondary | ICD-10-CM | POA: Diagnosis not present

## 2019-09-21 DIAGNOSIS — D485 Neoplasm of uncertain behavior of skin: Secondary | ICD-10-CM

## 2019-09-21 DIAGNOSIS — D492 Neoplasm of unspecified behavior of bone, soft tissue, and skin: Secondary | ICD-10-CM

## 2019-09-21 DIAGNOSIS — L819 Disorder of pigmentation, unspecified: Secondary | ICD-10-CM | POA: Diagnosis not present

## 2019-09-21 DIAGNOSIS — L309 Dermatitis, unspecified: Secondary | ICD-10-CM

## 2019-09-21 DIAGNOSIS — Z1283 Encounter for screening for malignant neoplasm of skin: Secondary | ICD-10-CM

## 2019-09-21 DIAGNOSIS — L821 Other seborrheic keratosis: Secondary | ICD-10-CM

## 2019-09-21 MED ORDER — AMZEEQ 4 % EX FOAM: 1.0000 "application " | Freq: Every day | CUTANEOUS | 3 refills | Status: DC

## 2019-09-21 NOTE — Progress Notes (Addendum)
Follow-Up Visit   Subjective  [name] [name] is a 59 y.o. male who presents for the following: Follow-up (Follow up on face rash around the mouth area from a C-Pap machine. We gave [facility] last visit and it helped patient alot. ) and Skin Problem (Check a few spots on arms. Dark spots per patient. ). General skin examination done no recurrence of the basal cell on his right shin but significant area of discoloration.  New 1.5 cm central eroded crust on the right inner calf and a focally ulcerated crust above the right eyebrow; probable new carcinomas, shave biopsies done and result will determine whether Mohs is indicated.  If Mohs is needed he prefers to go to [facility].  No atypical moles.  Benign keratoses on the left shoulder and back require no intervention.  Face is essentially clear with low-dose oral minocycline; he will see whether he can transition to topical minocycline (Amzeeq; if this is a covered medication).  We will check for biopsy results on MyChart in 2 days and call me.  Thick actinic keratoses on the right hand and left forearm were treated with freezing.  More diffuse AK's on the scalp may respond to field therapy next winter. growths Location: Right forehead and leg Duration: 1 year Quality: Increased size Associated Signs/Symptoms: Modifying Factors:  Severity:  Timing: Context:   The following portions of the chart were reviewed this encounter and updated as appropriate: Tobacco  Allergies  Meds  Problems  Med Hx  Surg Hx  Fam Hx      Objective  Well appearing patient in no apparent distress; mood and affect are within normal limits.  All skin waist up examined. Plus legs.   Assessment & Plan  AK (actinic keratosis) (2) Right Hand - Posterior; Left Forearm - Posterior  Destruction of lesion - Left Forearm - Posterior, Right Hand - Posterior  Destruction method: cryotherapy   Informed consent: discussed and consent obtained   Lesion destroyed using  liquid nitrogen: Yes   Region frozen until ice ball extended beyond lesion: Yes   Outcome: patient tolerated procedure well with no complications    Dermatitis (2) Left Buccal Cheek ; Right Buccal Cheek   Can use minocycline prn flares.  Neoplasm of skin (2) Right Lower Leg - Posterior  Skin / nail biopsy Type of biopsy: tangential   Informed consent: discussed and consent obtained   Anesthesia: the lesion was anesthetized in a standard fashion   Anesthetic:  1% lidocaine w/ epinephrine 1-100,000 local infiltration Instrument used: flexible razor blade   Hemostasis achieved with: ferric subsulfate   Outcome: patient tolerated procedure well   Post-procedure details: sterile dressing applied and wound care instructions given   Dressing type: petrolatum   Additional details:  Patient identified lesion of concern.  Lesion identified by physician.  Specimen 1 - Surgical pathology Differential Diagnosis: scc vs bcc Check Margins: No  Right Forehead  Skin / nail biopsy Type of biopsy: tangential   Informed consent: discussed and consent obtained   Anesthesia: the lesion was anesthetized in a standard fashion   Anesthetic:  1% lidocaine w/ epinephrine 1-100,000 local infiltration Instrument used: flexible razor blade   Hemostasis achieved with: ferric subsulfate   Outcome: patient tolerated procedure well   Post-procedure details: sterile dressing applied and wound care instructions given   Dressing type: petrolatum   Additional details:  Patient identified lesion of concern.  Lesion identified by physician.  Specimen 2 - Surgical pathology Differential  Diagnosis: scc vs bcc Check Margins: No  Rosacea Mid Tip of Nose  Taper oral, switch to topical minocycline.  Minocycline HCl Micronized (AMZEEQ) 4 % FOAM - Mid Tip of Nose

## 2019-09-21 NOTE — Patient Instructions (Signed)

## 2019-09-23 ENCOUNTER — Telehealth: Payer: Self-pay | Admitting: *Deleted

## 2019-09-23 NOTE — Telephone Encounter (Signed)
-----   Message from Lavonna Monarch, MD sent at 09/23/2019  6:28 AM EDT ----- Schedule surgery with Dr. Darene Lamer

## 2019-09-23 NOTE — Telephone Encounter (Signed)
Calling to give pathology results , no answer no voicemail.

## 2019-09-25 ENCOUNTER — Encounter: Payer: Self-pay | Admitting: Dermatology

## 2019-09-28 NOTE — Telephone Encounter (Signed)
Phone call to patient with his Pathology results.  Voicemail left for patient to give the office a call back.

## 2019-09-28 NOTE — Telephone Encounter (Signed)
-----   Message from Lavonna Monarch, MD sent at 09/23/2019  6:28 AM EDT ----- Schedule surgery with Dr. Darene Lamer

## 2019-09-30 NOTE — Telephone Encounter (Signed)
Patient returning call. Patient says to leave detailed message on this number [phone].

## 2019-09-30 NOTE — Telephone Encounter (Signed)
Called patient back to give results. Left detailed message per patient request. Patient still needs to call back to schedule surgery with Dr Denna Haggard.

## 2019-10-03 NOTE — Telephone Encounter (Signed)
Phone call to patient with Pathology results.  Patient aware of results and appointment made with Dr. Denna Haggard on 11/03/2019 @ 1:30pm.

## 2019-10-03 NOTE — Telephone Encounter (Signed)
-----   Message from Lavonna Monarch, MD sent at 09/23/2019  6:28 AM EDT ----- Schedule surgery with Dr. Darene Lamer

## 2019-11-03 ENCOUNTER — Ambulatory Visit (INDEPENDENT_AMBULATORY_CARE_PROVIDER_SITE_OTHER): Payer: BC Managed Care – PPO | Admitting: Dermatology

## 2019-11-03 ENCOUNTER — Other Ambulatory Visit: Payer: Self-pay

## 2019-11-03 DIAGNOSIS — C44719 Basal cell carcinoma of skin of left lower limb, including hip: Secondary | ICD-10-CM

## 2019-11-03 DIAGNOSIS — C44712 Basal cell carcinoma of skin of right lower limb, including hip: Secondary | ICD-10-CM | POA: Diagnosis not present

## 2019-11-12 ENCOUNTER — Encounter: Payer: Self-pay | Admitting: Dermatology

## 2019-11-12 NOTE — Progress Notes (Signed)
   Follow-Up Visit   Subjective  [name] [name] is a 59 y.o. male who presents for the following: No chief complaint on file.Marland Kitchen BCC Location: Right leg Duration:  Quality:  Associated Signs/Symptoms: Modifying Factors:  Severity:  Timing: Context: For treatment  The following portions of the chart were reviewed this encounter and updated as appropriate: Tobacco  Allergies  Meds  Problems  Med Hx  Surg Hx  Fam Hx      Objective  Well appearing patient in no apparent distress; mood and affect are within normal limits.  A focused examination was performed including Sun exposed areas and legs. Relevant physical exam findings are noted in the Assessment and Plan.   Assessment & Plan  Basal cell carcinoma (BCC) of skin of left lower extremity including hip Right Lower Leg - Posterior  Destruction of lesion Complexity: simple   Destruction method: electrodesiccation and curettage   Informed consent: discussed and consent obtained   Timeout:  patient name, date of birth, surgical site, and procedure verified Anesthesia: the lesion was anesthetized in a standard fashion   Anesthetic:  1% lidocaine w/ epinephrine 1-100,000 local infiltration Curettage performed in three different directions: Yes   Curettage cycles:  3 Lesion length (cm):  1.4 Lesion width (cm):  1 Margin per side (cm):  0 Final wound size (cm):  1.4 Hemostasis achieved with:  ferric subsulfate Outcome: patient tolerated procedure well with no complications   Post-procedure details: wound care instructions given   Additional details:  Inoculated with parenteral 5% fluorouracil

## 2019-12-05 DIAGNOSIS — Z9989 Dependence on other enabling machines and devices: Secondary | ICD-10-CM | POA: Diagnosis not present

## 2019-12-05 DIAGNOSIS — G4733 Obstructive sleep apnea (adult) (pediatric): Secondary | ICD-10-CM | POA: Diagnosis not present

## 2019-12-07 DIAGNOSIS — R7303 Prediabetes: Secondary | ICD-10-CM | POA: Diagnosis not present

## 2019-12-07 DIAGNOSIS — Z0000 Encounter for general adult medical examination without abnormal findings: Secondary | ICD-10-CM | POA: Diagnosis not present

## 2019-12-07 DIAGNOSIS — N529 Male erectile dysfunction, unspecified: Secondary | ICD-10-CM | POA: Diagnosis not present

## 2019-12-07 DIAGNOSIS — M2550 Pain in unspecified joint: Secondary | ICD-10-CM | POA: Diagnosis not present

## 2019-12-07 DIAGNOSIS — E7800 Pure hypercholesterolemia, unspecified: Secondary | ICD-10-CM | POA: Diagnosis not present

## 2019-12-07 DIAGNOSIS — E291 Testicular hypofunction: Secondary | ICD-10-CM | POA: Diagnosis not present

## 2019-12-13 ENCOUNTER — Other Ambulatory Visit: Payer: Self-pay | Admitting: Dermatology

## 2020-01-05 DIAGNOSIS — G4733 Obstructive sleep apnea (adult) (pediatric): Secondary | ICD-10-CM | POA: Diagnosis not present

## 2020-01-18 DIAGNOSIS — Z20822 Contact with and (suspected) exposure to covid-19: Secondary | ICD-10-CM | POA: Diagnosis not present

## 2020-03-13 DIAGNOSIS — Z20822 Contact with and (suspected) exposure to covid-19: Secondary | ICD-10-CM | POA: Diagnosis not present

## 2020-03-23 DIAGNOSIS — Z20822 Contact with and (suspected) exposure to covid-19: Secondary | ICD-10-CM | POA: Diagnosis not present

## 2020-03-31 DIAGNOSIS — Z20822 Contact with and (suspected) exposure to covid-19: Secondary | ICD-10-CM | POA: Diagnosis not present

## 2020-04-18 DIAGNOSIS — Z20822 Contact with and (suspected) exposure to covid-19: Secondary | ICD-10-CM | POA: Diagnosis not present

## 2020-04-26 DIAGNOSIS — Z20822 Contact with and (suspected) exposure to covid-19: Secondary | ICD-10-CM | POA: Diagnosis not present

## 2020-05-13 DIAGNOSIS — Z20822 Contact with and (suspected) exposure to covid-19: Secondary | ICD-10-CM | POA: Diagnosis not present

## 2020-05-26 DIAGNOSIS — Z20822 Contact with and (suspected) exposure to covid-19: Secondary | ICD-10-CM | POA: Diagnosis not present

## 2020-06-14 DIAGNOSIS — Z20822 Contact with and (suspected) exposure to covid-19: Secondary | ICD-10-CM | POA: Diagnosis not present

## 2020-06-21 DIAGNOSIS — Z20822 Contact with and (suspected) exposure to covid-19: Secondary | ICD-10-CM | POA: Diagnosis not present

## 2020-06-28 ENCOUNTER — Other Ambulatory Visit: Payer: Self-pay | Admitting: Dermatology

## 2020-06-28 DIAGNOSIS — Z20822 Contact with and (suspected) exposure to covid-19: Secondary | ICD-10-CM | POA: Diagnosis not present

## 2020-07-05 DIAGNOSIS — Z20822 Contact with and (suspected) exposure to covid-19: Secondary | ICD-10-CM | POA: Diagnosis not present

## 2020-07-09 DIAGNOSIS — Z1159 Encounter for screening for other viral diseases: Secondary | ICD-10-CM | POA: Diagnosis not present

## 2020-07-09 DIAGNOSIS — Z1152 Encounter for screening for COVID-19: Secondary | ICD-10-CM | POA: Diagnosis not present

## 2020-07-09 DIAGNOSIS — Z20822 Contact with and (suspected) exposure to covid-19: Secondary | ICD-10-CM | POA: Diagnosis not present

## 2020-07-19 DIAGNOSIS — Z20822 Contact with and (suspected) exposure to covid-19: Secondary | ICD-10-CM | POA: Diagnosis not present

## 2020-07-31 DIAGNOSIS — Z20822 Contact with and (suspected) exposure to covid-19: Secondary | ICD-10-CM | POA: Diagnosis not present

## 2020-08-02 DIAGNOSIS — G4733 Obstructive sleep apnea (adult) (pediatric): Secondary | ICD-10-CM | POA: Diagnosis not present

## 2020-09-21 DIAGNOSIS — Z20822 Contact with and (suspected) exposure to covid-19: Secondary | ICD-10-CM | POA: Diagnosis not present

## 2020-12-18 DIAGNOSIS — E7800 Pure hypercholesterolemia, unspecified: Secondary | ICD-10-CM | POA: Diagnosis not present

## 2020-12-18 DIAGNOSIS — Z0000 Encounter for general adult medical examination without abnormal findings: Secondary | ICD-10-CM | POA: Diagnosis not present

## 2020-12-18 DIAGNOSIS — N529 Male erectile dysfunction, unspecified: Secondary | ICD-10-CM | POA: Diagnosis not present

## 2020-12-18 DIAGNOSIS — Z125 Encounter for screening for malignant neoplasm of prostate: Secondary | ICD-10-CM | POA: Diagnosis not present

## 2020-12-18 DIAGNOSIS — E291 Testicular hypofunction: Secondary | ICD-10-CM | POA: Diagnosis not present

## 2020-12-18 DIAGNOSIS — R7303 Prediabetes: Secondary | ICD-10-CM | POA: Diagnosis not present

## 2020-12-26 DIAGNOSIS — M48061 Spinal stenosis, lumbar region without neurogenic claudication: Secondary | ICD-10-CM | POA: Diagnosis not present

## 2020-12-26 DIAGNOSIS — M545 Low back pain, unspecified: Secondary | ICD-10-CM | POA: Diagnosis not present

## 2020-12-26 DIAGNOSIS — M25552 Pain in left hip: Secondary | ICD-10-CM | POA: Diagnosis not present

## 2020-12-26 DIAGNOSIS — Z96649 Presence of unspecified artificial hip joint: Secondary | ICD-10-CM | POA: Diagnosis not present

## 2021-01-07 ENCOUNTER — Other Ambulatory Visit: Payer: Self-pay | Admitting: Orthopedic Surgery

## 2021-01-07 DIAGNOSIS — M5459 Other low back pain: Secondary | ICD-10-CM

## 2021-01-22 ENCOUNTER — Other Ambulatory Visit: Payer: Self-pay

## 2021-01-22 ENCOUNTER — Ambulatory Visit
Admission: RE | Admit: 2021-01-22 | Discharge: 2021-01-22 | Disposition: A | Discharge status: Home / Self Care | Payer: BC Managed Care – PPO | Source: Ambulatory Visit | Attending: Orthopedic Surgery | Admitting: Orthopedic Surgery

## 2021-01-22 DIAGNOSIS — M5459 Other low back pain: Secondary | ICD-10-CM

## 2021-01-22 DIAGNOSIS — M545 Low back pain, unspecified: Secondary | ICD-10-CM | POA: Diagnosis not present

## 2021-01-22 DIAGNOSIS — M48061 Spinal stenosis, lumbar region without neurogenic claudication: Secondary | ICD-10-CM | POA: Diagnosis not present

## 2021-01-31 DIAGNOSIS — E291 Testicular hypofunction: Secondary | ICD-10-CM | POA: Diagnosis not present

## 2021-01-31 DIAGNOSIS — R748 Abnormal levels of other serum enzymes: Secondary | ICD-10-CM | POA: Diagnosis not present

## 2021-02-13 DIAGNOSIS — C61 Malignant neoplasm of prostate: Secondary | ICD-10-CM

## 2021-02-13 HISTORY — DX: Malignant neoplasm of prostate: C61

## 2021-02-15 DIAGNOSIS — R972 Elevated prostate specific antigen [PSA]: Secondary | ICD-10-CM | POA: Diagnosis not present

## 2021-02-16 ENCOUNTER — Other Ambulatory Visit: Payer: Self-pay | Admitting: Dermatology

## 2021-02-18 ENCOUNTER — Other Ambulatory Visit: Payer: Self-pay

## 2021-02-18 ENCOUNTER — Ambulatory Visit (INDEPENDENT_AMBULATORY_CARE_PROVIDER_SITE_OTHER): Payer: BC Managed Care – PPO | Admitting: Podiatry

## 2021-02-18 ENCOUNTER — Ambulatory Visit (INDEPENDENT_AMBULATORY_CARE_PROVIDER_SITE_OTHER): Payer: BC Managed Care – PPO

## 2021-02-18 DIAGNOSIS — M2041 Other hammer toe(s) (acquired), right foot: Secondary | ICD-10-CM | POA: Diagnosis not present

## 2021-02-18 DIAGNOSIS — M2011 Hallux valgus (acquired), right foot: Secondary | ICD-10-CM | POA: Diagnosis not present

## 2021-02-18 DIAGNOSIS — M2012 Hallux valgus (acquired), left foot: Secondary | ICD-10-CM

## 2021-02-18 DIAGNOSIS — M21611 Bunion of right foot: Secondary | ICD-10-CM

## 2021-02-18 NOTE — Telephone Encounter (Signed)
Office visit for any refills

## 2021-02-19 NOTE — Progress Notes (Signed)
Subjective: 60 y.o. male presenting today established to the practice but not seen since 2020 presenting for symptomatic bunions to the bilateral feet that is been a gradual onset for several years.  Patient states he is interested in surgery.  Conservative treatments have been unsuccessful including shoe gear modifications.  Patient is very active and healthy but he experiences a significant amount of pain and it affects his quality of life and daily activities.  He plays tennis and walks but the pain of the bunions is becoming somewhat debilitating.  He presents for further treatment and evaluation  Past Medical History:  Diagnosis Date   Biceps tendon rupture    left   H/O seasonal allergies    Hypercholesterolemia    Nodular basal cell carcinoma (BCC) 05/04/2018   Right Lower Leg (MOH's)   OA (osteoarthritis)    BL hips   Sleep apnea    wears CPAP    Superficial basal cell carcinoma (BCC) 08/26/2011   Right Upper Back (never treated)   Wears contact lenses     Objective: Physical Exam General: The patient is alert and oriented x3 in no acute distress.  Dermatology: Skin is cool, dry and supple bilateral lower extremities. Negative for open lesions or macerations.  Vascular: Palpable pedal pulses bilaterally. No edema or erythema noted. Capillary refill within normal limits.  Neurological: Epicritic and protective threshold grossly intact bilaterally.   Musculoskeletal Exam: Clinical evidence of bunion deformity noted to the respective foot. There is moderate pain on palpation range of motion of the first MPJ. Lateral deviation of the hallux noted consistent with hallux abductovalgus. Hammertoe contracture also noted on clinical exam to digits #2 of the right foot. Symptomatic pain on palpation and range of motion also noted to the metatarsal phalangeal joints of the respective hammertoe digits.    Radiographic Exam: Increased intermetatarsal angle greater than 15 with a  hallux abductus angle greater than 30 noted on AP view. Moderate degenerative changes noted within the first MPJ. Contracture deformity also noted to the interphalangeal joints and MPJs of the digits of the respective hammertoes. Shortening with degenerative changes noted to the second MTPJ of the left foot with a small orthopedic screw embedded within the head of the second metatarsal.    Assessment: 1. HAV w/ bunion deformity bilateral.  RT > LT 2. Hammertoe deformity 2nd RT  3. H/o hammertoe repair 2nd LT by Dr. Sharol Given   Plan of Care:  1. Patient was evaluated. X-Rays reviewed. 2. Today we discussed in detail the conservative versus surgical management of the presenting pathology.  We also discussed different surgical options including Lapidus versus double osteotomy to correct for the bunion deformity.  I do not believe that an isolated Lapidus would correct for the entire bunion and an Aiken osteotomy would likely need to be performed as well which would include a long linear incision over the foot.  I feel I would be able to achieve good correction and alignment of the first ray by performing a double osteotomy including a long-arm Austin and Akin osteotomy within the small incision site.  The patient opts for surgical management. All possible complications and details of the procedure were explained. All patient questions were answered. No guarantees were expressed or implied. 3. Authorization for surgery will be initiated upon the patient's return in January 2023. Surgery will consist of bunionectomy with double osteotomy right.  PIPJ arthroplasty with MTPJ capsulotomy and Weil shortening osteotomy right second digit 4.  Patient would like  to have surgery in February 2023 after returning from a [place] trip.  Recommend that the patient return to the clinic January 2023 for surgical consult and scheduling  *Manages doctor practices     Edrick Kins, DPM [facility]  Dr. Edrick Kins, [address]                                        [place], [place] [address]                Office [phone]  Fax [phone]

## 2021-02-26 ENCOUNTER — Other Ambulatory Visit: Payer: Self-pay | Admitting: Dermatology

## 2021-02-27 ENCOUNTER — Ambulatory Visit (INDEPENDENT_AMBULATORY_CARE_PROVIDER_SITE_OTHER): Payer: BC Managed Care – PPO | Admitting: Physician Assistant

## 2021-02-27 ENCOUNTER — Encounter: Payer: Self-pay | Admitting: Physician Assistant

## 2021-02-27 ENCOUNTER — Other Ambulatory Visit: Payer: Self-pay

## 2021-02-27 DIAGNOSIS — B354 Tinea corporis: Secondary | ICD-10-CM

## 2021-02-27 DIAGNOSIS — Z1283 Encounter for screening for malignant neoplasm of skin: Secondary | ICD-10-CM

## 2021-02-27 DIAGNOSIS — L570 Actinic keratosis: Secondary | ICD-10-CM

## 2021-02-27 DIAGNOSIS — L219 Seborrheic dermatitis, unspecified: Secondary | ICD-10-CM | POA: Diagnosis not present

## 2021-02-27 DIAGNOSIS — C44519 Basal cell carcinoma of skin of other part of trunk: Secondary | ICD-10-CM | POA: Diagnosis not present

## 2021-02-27 DIAGNOSIS — C44319 Basal cell carcinoma of skin of other parts of face: Secondary | ICD-10-CM | POA: Diagnosis not present

## 2021-02-27 DIAGNOSIS — L710 Perioral dermatitis: Secondary | ICD-10-CM | POA: Diagnosis not present

## 2021-02-27 DIAGNOSIS — D485 Neoplasm of uncertain behavior of skin: Secondary | ICD-10-CM

## 2021-02-27 MED ORDER — CLOBETASOL PROP EMOLLIENT BASE 0.05 % EX CREA: TOPICAL_CREAM | CUTANEOUS | 6 refills | Status: DC

## 2021-02-27 MED ORDER — MINOCYCLINE HCL 50 MG PO TABS: ORAL_TABLET | ORAL | 2 refills | Status: DC

## 2021-02-27 MED ORDER — KETOCONAZOLE 2 % EX CREA: 1.0000 "application " | TOPICAL_CREAM | Freq: Two times a day (BID) | CUTANEOUS | 10 refills | Status: AC

## 2021-02-27 NOTE — Progress Notes (Signed)
Follow-Up Visit   Subjective  [name] [name] is a 60 y.o. male who presents for the following: Annual Exam (Few scaly spots on scalp & needs a refill on mcn). He intermittently gets pink scales on his cheeks and beside his nose. Treatment with antibiotic helped in the past. Top of his scalp gets irritated and scaly. He has a large patch of scale on his left lower leg. No symptoms.   The following portions of the chart were reviewed this encounter and updated as appropriate:  Tobacco  Allergies  Meds  Problems  Med Hx  Surg Hx  Fam Hx      Objective  Well appearing patient in no apparent distress; mood and affect are within normal limits.  A full examination was performed including scalp, head, eyes, ears, nose, lips, neck, chest, axillae, abdomen, back, buttocks, bilateral upper extremities, bilateral lower extremities, hands, feet, fingers, toes, fingernails, and toenails. All findings within normal limits unless otherwise noted below.  Mid Back Scaly erythematous macule        Right Posterior Mandible Pearly papule with telangectasia.        Right Malar Cheek Scaly erythematous macule        Right Hallux Toe Nail Plate Large oval plaque with advancing scaly border. Toenails thick and yellow.   Right Buccal Cheek Pink scale.  Left Occipital Scalp, Mid Parietal Scalp (2) Erythematous patches with gritty scale.     Mid Parietal Scalp Scalp scale and irritation with a few pink crusts.   Assessment & Plan  Neoplasm of uncertain behavior of skin (3) Mid Back  Skin / nail biopsy Type of biopsy: tangential   Informed consent: discussed and consent obtained   Timeout: patient name, date of birth, surgical site, and procedure verified   Procedure prep:  Patient was prepped and draped in usual sterile fashion (Non sterile) Prep type:  Chlorhexidine Anesthesia: the lesion was anesthetized in a standard fashion   Anesthetic:  1% lidocaine w/  epinephrine 1-100,000 local infiltration Instrument used: flexible razor blade   Outcome: patient tolerated procedure well   Post-procedure details: wound care instructions given    Specimen 1 - Surgical pathology Differential Diagnosis: bcc vs scc  Check Margins: No  Right Posterior Mandible  Skin / nail biopsy Type of biopsy: tangential   Informed consent: discussed and consent obtained   Timeout: patient name, date of birth, surgical site, and procedure verified   Procedure prep:  Patient was prepped and draped in usual sterile fashion (Non sterile) Prep type:  Chlorhexidine Anesthesia: the lesion was anesthetized in a standard fashion   Anesthetic:  1% lidocaine w/ epinephrine 1-100,000 local infiltration Instrument used: flexible razor blade   Outcome: patient tolerated procedure well   Post-procedure details: wound care instructions given    Specimen 2 - Surgical pathology Differential Diagnosis: bcc vs scc  Check Margins: No  Right Malar Cheek  Skin / nail biopsy Type of biopsy: tangential   Informed consent: discussed and consent obtained   Timeout: patient name, date of birth, surgical site, and procedure verified   Procedure prep:  Patient was prepped and draped in usual sterile fashion (Non sterile) Prep type:  Chlorhexidine Anesthesia: the lesion was anesthetized in a standard fashion   Anesthetic:  1% lidocaine w/ epinephrine 1-100,000 local infiltration Instrument used: flexible razor blade   Outcome: patient tolerated procedure well   Post-procedure details: wound care instructions given    Specimen 3 - Surgical pathology Differential Diagnosis:  bcc vs scc  Check Margins: No  Tinea corporis Right Hallux Toe Nail Plate  ketoconazole (NIZORAL) 2 % cream - Right Hallux Toe Nail Plate Apply 1 application topically in the morning and at bedtime.  Perioral dermatitis Right Buccal Cheek  minocycline (DYNACIN) 50 MG tablet - Right Buccal Cheek TAKE 1  TABLET BY MOUTH TWICE DAILY FOR 6 WEEKS WITH FOOD  AK (actinic keratosis) (3) Left Occipital Scalp; Mid Parietal Scalp (2)  Destruction of lesion - Left Occipital Scalp, Mid Parietal Scalp (2) Complexity: simple   Destruction method: cryotherapy   Informed consent: discussed and consent obtained   Timeout:  patient name, date of birth, surgical site, and procedure verified Lesion destroyed using liquid nitrogen: Yes   Outcome: patient tolerated procedure well with no complications    Seborrheic dermatitis Mid Parietal Scalp  Clobetasol Prop Emollient Base (CLOBETASOL PROPIONATE E) 0.05 % emollient cream - Mid Parietal Scalp Apply to affected area qd    I, Leydy Worthey, PA-C, have reviewed all documentation's for this visit.  The documentation on 02/27/21 for the exam, diagnosis, procedures and orders are all accurate and complete.

## 2021-02-27 NOTE — Patient Instructions (Signed)

## 2021-03-07 ENCOUNTER — Telehealth: Payer: Self-pay

## 2021-03-07 NOTE — Telephone Encounter (Signed)
Ks= surgery and mohs referral [facility] for patient

## 2021-03-07 NOTE — Telephone Encounter (Signed)
-----   Message from Warren Danes, [place] sent at 03/05/2021  4:35 PM EDT ----- 1- 30 with KRS 2 Mohs

## 2021-04-08 DIAGNOSIS — R972 Elevated prostate specific antigen [PSA]: Secondary | ICD-10-CM | POA: Diagnosis not present

## 2021-04-08 DIAGNOSIS — C61 Malignant neoplasm of prostate: Secondary | ICD-10-CM | POA: Diagnosis not present

## 2021-04-15 DIAGNOSIS — C61 Malignant neoplasm of prostate: Secondary | ICD-10-CM | POA: Diagnosis not present

## 2021-05-17 ENCOUNTER — Other Ambulatory Visit (HOSPITAL_COMMUNITY): Payer: Self-pay | Admitting: Urology

## 2021-05-21 ENCOUNTER — Other Ambulatory Visit (HOSPITAL_COMMUNITY): Payer: Self-pay | Admitting: Urology

## 2021-05-21 ENCOUNTER — Other Ambulatory Visit: Payer: Self-pay | Admitting: Urology

## 2021-05-21 DIAGNOSIS — C61 Malignant neoplasm of prostate: Secondary | ICD-10-CM | POA: Diagnosis not present

## 2021-05-26 ENCOUNTER — Other Ambulatory Visit: Payer: Self-pay | Admitting: Physician Assistant

## 2021-05-26 DIAGNOSIS — L710 Perioral dermatitis: Secondary | ICD-10-CM

## 2021-05-28 DIAGNOSIS — C61 Malignant neoplasm of prostate: Secondary | ICD-10-CM | POA: Diagnosis not present

## 2021-05-29 ENCOUNTER — Encounter: Payer: Self-pay | Admitting: Podiatry

## 2021-05-30 ENCOUNTER — Inpatient Hospital Stay (HOSPITAL_COMMUNITY): Admission: RE | Admit: 2021-05-30 | Payer: BC Managed Care – PPO | Source: Ambulatory Visit

## 2021-06-04 ENCOUNTER — Other Ambulatory Visit: Payer: Self-pay

## 2021-06-04 ENCOUNTER — Encounter (HOSPITAL_COMMUNITY)
Admission: RE | Admit: 2021-06-04 | Discharge: 2021-06-04 | Disposition: A | Discharge status: Home / Self Care | Payer: BC Managed Care – PPO | Source: Ambulatory Visit | Attending: Urology | Admitting: Urology

## 2021-06-04 ENCOUNTER — Encounter (HOSPITAL_COMMUNITY): Admission: RE | Admit: 2021-06-04 | Payer: BC Managed Care – PPO | Source: Ambulatory Visit

## 2021-06-04 DIAGNOSIS — C61 Malignant neoplasm of prostate: Secondary | ICD-10-CM | POA: Diagnosis not present

## 2021-06-04 MED ORDER — PIFLIFOLASTAT F 18 (PYLARIFY) INJECTION
9.0000 | Freq: Once | INTRAVENOUS | Status: AC
  Administered 2021-06-04: 14:00:00 9 via INTRAVENOUS

## 2021-06-12 DIAGNOSIS — C61 Malignant neoplasm of prostate: Secondary | ICD-10-CM | POA: Diagnosis not present

## 2021-06-13 DIAGNOSIS — Z713 Dietary counseling and surveillance: Secondary | ICD-10-CM | POA: Diagnosis not present

## 2021-06-13 DIAGNOSIS — R7303 Prediabetes: Secondary | ICD-10-CM | POA: Diagnosis not present

## 2021-06-24 ENCOUNTER — Ambulatory Visit: Payer: BC Managed Care – PPO | Admitting: Podiatry

## 2021-07-09 ENCOUNTER — Encounter: Payer: Self-pay | Admitting: Dermatology

## 2021-07-09 ENCOUNTER — Ambulatory Visit (INDEPENDENT_AMBULATORY_CARE_PROVIDER_SITE_OTHER): Payer: BC Managed Care – PPO | Admitting: Dermatology

## 2021-07-09 ENCOUNTER — Other Ambulatory Visit: Payer: Self-pay

## 2021-07-09 DIAGNOSIS — Z85828 Personal history of other malignant neoplasm of skin: Secondary | ICD-10-CM | POA: Diagnosis not present

## 2021-07-09 DIAGNOSIS — L821 Other seborrheic keratosis: Secondary | ICD-10-CM

## 2021-07-09 DIAGNOSIS — L570 Actinic keratosis: Secondary | ICD-10-CM | POA: Diagnosis not present

## 2021-07-09 DIAGNOSIS — Z1283 Encounter for screening for malignant neoplasm of skin: Secondary | ICD-10-CM | POA: Diagnosis not present

## 2021-07-28 ENCOUNTER — Encounter: Payer: Self-pay | Admitting: Dermatology

## 2021-07-29 NOTE — Progress Notes (Signed)
° °  Follow-Up Visit   Subjective  [name] [name] is a 61 y.o. male who presents for the following: Hair/Scalp Problem (Pt here to have scalp evaluated due to itching. Pt is getting rash on his face possibly from a CPAP mask. Pt concerned about a site on he face he had bx and believes it is not healing properly and thinks he needs plastic. Pt has some spots on the chest he would like evaluated ).  Biopsy site on right jawline concerns patient's plus several other spots. Location:  Duration:  Quality:  Associated Signs/Symptoms: Modifying Factors:  Severity:  Timing: Context:   Objective  Well appearing patient in no apparent distress; mood and affect are within normal limits. Right Upper Back There is still subtly waxy pink flat 26millimeter; if there is growth or crusting he will return to rebiopsy.     Torso - Posterior (Back) Waist up skin examination: No new nonmelanoma skin cancers or atypical pigmented spots.  The lesion biopsied on on the right cheek was to have been treated with Mohs surgery but apparently this was never done.  Right Flank Tan 7 mm textured flattopped papule with typical dermoscopy  Mid Parietal Scalp Multiple small gritty pink crusts  right posterior mandible Persistent eroded pearly papule    All skin waist up examined.   Assessment & Plan    History of basal cell cancer Right Upper Back  Recheck as needed change  Encounter for screening for malignant neoplasm of skin Torso - Posterior (Back)  Reschedule Mohs after his prostate treatment.  Seborrheic keratosis Right Flank  Leave if stable  AK (actinic keratosis) Mid Parietal Scalp  Cream will be deferred until next year due to recent prostate cancer dx and treatment- will reconsider tolak cream next year.   History of basal cell carcinoma right posterior mandible  Still needs MOHs treatment per 02/27/21 biopsy report- will be deferred until surgery and radiation for prostate.  Patient aware to call us back to do referral to skin surgery center once done with radiation.      I, Lavonna Monarch, MD, have reviewed all documentation for this visit.  The documentation on 07/29/21 for the exam, diagnosis, procedures, and orders are all accurate and complete.

## 2021-08-26 ENCOUNTER — Encounter: Payer: Self-pay | Admitting: Dermatology

## 2021-08-26 MED ORDER — CLOBETASOL PROPIONATE 0.05 % EX FOAM: Freq: Every day | CUTANEOUS | 1 refills | Status: DC

## 2021-08-26 NOTE — Addendum Note (Signed)
Addended by: Lennie Odor on: 08/26/2021 02:16 PM ? ? Modules accepted: Orders ? ?

## 2021-09-04 DIAGNOSIS — C61 Malignant neoplasm of prostate: Secondary | ICD-10-CM | POA: Diagnosis not present

## 2021-09-04 DIAGNOSIS — Z510 Encounter for antineoplastic radiation therapy: Secondary | ICD-10-CM | POA: Diagnosis not present

## 2021-09-18 DIAGNOSIS — C61 Malignant neoplasm of prostate: Secondary | ICD-10-CM | POA: Diagnosis not present

## 2021-09-27 DIAGNOSIS — Z510 Encounter for antineoplastic radiation therapy: Secondary | ICD-10-CM | POA: Diagnosis not present

## 2021-09-27 DIAGNOSIS — C61 Malignant neoplasm of prostate: Secondary | ICD-10-CM | POA: Diagnosis not present

## 2021-09-30 DIAGNOSIS — C61 Malignant neoplasm of prostate: Secondary | ICD-10-CM | POA: Diagnosis not present

## 2021-09-30 DIAGNOSIS — Z510 Encounter for antineoplastic radiation therapy: Secondary | ICD-10-CM | POA: Diagnosis not present

## 2021-10-01 DIAGNOSIS — C61 Malignant neoplasm of prostate: Secondary | ICD-10-CM | POA: Diagnosis not present

## 2021-10-01 DIAGNOSIS — Z510 Encounter for antineoplastic radiation therapy: Secondary | ICD-10-CM | POA: Diagnosis not present

## 2021-10-02 DIAGNOSIS — C61 Malignant neoplasm of prostate: Secondary | ICD-10-CM | POA: Diagnosis not present

## 2021-10-02 DIAGNOSIS — Z9889 Other specified postprocedural states: Secondary | ICD-10-CM | POA: Diagnosis not present

## 2021-10-02 DIAGNOSIS — Z510 Encounter for antineoplastic radiation therapy: Secondary | ICD-10-CM | POA: Diagnosis not present

## 2021-10-03 DIAGNOSIS — C61 Malignant neoplasm of prostate: Secondary | ICD-10-CM | POA: Diagnosis not present

## 2021-10-03 DIAGNOSIS — Z510 Encounter for antineoplastic radiation therapy: Secondary | ICD-10-CM | POA: Diagnosis not present

## 2021-10-04 DIAGNOSIS — Z510 Encounter for antineoplastic radiation therapy: Secondary | ICD-10-CM | POA: Diagnosis not present

## 2021-10-04 DIAGNOSIS — C61 Malignant neoplasm of prostate: Secondary | ICD-10-CM | POA: Diagnosis not present

## 2021-10-07 ENCOUNTER — Other Ambulatory Visit: Payer: Self-pay | Admitting: Dermatology

## 2021-10-07 DIAGNOSIS — C61 Malignant neoplasm of prostate: Secondary | ICD-10-CM | POA: Diagnosis not present

## 2021-10-07 DIAGNOSIS — Z79818 Long term (current) use of other agents affecting estrogen receptors and estrogen levels: Secondary | ICD-10-CM | POA: Diagnosis not present

## 2021-10-07 DIAGNOSIS — Z510 Encounter for antineoplastic radiation therapy: Secondary | ICD-10-CM | POA: Diagnosis not present

## 2021-10-08 DIAGNOSIS — C61 Malignant neoplasm of prostate: Secondary | ICD-10-CM | POA: Diagnosis not present

## 2021-10-08 DIAGNOSIS — Z79818 Long term (current) use of other agents affecting estrogen receptors and estrogen levels: Secondary | ICD-10-CM | POA: Diagnosis not present

## 2021-10-08 DIAGNOSIS — Z510 Encounter for antineoplastic radiation therapy: Secondary | ICD-10-CM | POA: Diagnosis not present

## 2021-10-09 DIAGNOSIS — Z79818 Long term (current) use of other agents affecting estrogen receptors and estrogen levels: Secondary | ICD-10-CM | POA: Diagnosis not present

## 2021-10-09 DIAGNOSIS — Z510 Encounter for antineoplastic radiation therapy: Secondary | ICD-10-CM | POA: Diagnosis not present

## 2021-10-09 DIAGNOSIS — C61 Malignant neoplasm of prostate: Secondary | ICD-10-CM | POA: Diagnosis not present

## 2021-10-10 DIAGNOSIS — Z79818 Long term (current) use of other agents affecting estrogen receptors and estrogen levels: Secondary | ICD-10-CM | POA: Diagnosis not present

## 2021-10-10 DIAGNOSIS — Z510 Encounter for antineoplastic radiation therapy: Secondary | ICD-10-CM | POA: Diagnosis not present

## 2021-10-10 DIAGNOSIS — C61 Malignant neoplasm of prostate: Secondary | ICD-10-CM | POA: Diagnosis not present

## 2021-10-11 DIAGNOSIS — Z510 Encounter for antineoplastic radiation therapy: Secondary | ICD-10-CM | POA: Diagnosis not present

## 2021-10-11 DIAGNOSIS — Z79818 Long term (current) use of other agents affecting estrogen receptors and estrogen levels: Secondary | ICD-10-CM | POA: Diagnosis not present

## 2021-10-11 DIAGNOSIS — C61 Malignant neoplasm of prostate: Secondary | ICD-10-CM | POA: Diagnosis not present

## 2021-10-14 DIAGNOSIS — C61 Malignant neoplasm of prostate: Secondary | ICD-10-CM | POA: Diagnosis not present

## 2021-10-14 DIAGNOSIS — Z79818 Long term (current) use of other agents affecting estrogen receptors and estrogen levels: Secondary | ICD-10-CM | POA: Diagnosis not present

## 2021-10-14 DIAGNOSIS — Z510 Encounter for antineoplastic radiation therapy: Secondary | ICD-10-CM | POA: Diagnosis not present

## 2021-10-15 DIAGNOSIS — Z79818 Long term (current) use of other agents affecting estrogen receptors and estrogen levels: Secondary | ICD-10-CM | POA: Diagnosis not present

## 2021-10-15 DIAGNOSIS — Z510 Encounter for antineoplastic radiation therapy: Secondary | ICD-10-CM | POA: Diagnosis not present

## 2021-10-15 DIAGNOSIS — C61 Malignant neoplasm of prostate: Secondary | ICD-10-CM | POA: Diagnosis not present

## 2021-10-16 DIAGNOSIS — Z510 Encounter for antineoplastic radiation therapy: Secondary | ICD-10-CM | POA: Diagnosis not present

## 2021-10-16 DIAGNOSIS — C61 Malignant neoplasm of prostate: Secondary | ICD-10-CM | POA: Diagnosis not present

## 2021-10-16 DIAGNOSIS — Z79818 Long term (current) use of other agents affecting estrogen receptors and estrogen levels: Secondary | ICD-10-CM | POA: Diagnosis not present

## 2021-10-17 DIAGNOSIS — Z79818 Long term (current) use of other agents affecting estrogen receptors and estrogen levels: Secondary | ICD-10-CM | POA: Diagnosis not present

## 2021-10-17 DIAGNOSIS — C61 Malignant neoplasm of prostate: Secondary | ICD-10-CM | POA: Diagnosis not present

## 2021-10-17 DIAGNOSIS — Z510 Encounter for antineoplastic radiation therapy: Secondary | ICD-10-CM | POA: Diagnosis not present

## 2021-10-18 DIAGNOSIS — Z79818 Long term (current) use of other agents affecting estrogen receptors and estrogen levels: Secondary | ICD-10-CM | POA: Diagnosis not present

## 2021-10-18 DIAGNOSIS — Z510 Encounter for antineoplastic radiation therapy: Secondary | ICD-10-CM | POA: Diagnosis not present

## 2021-10-18 DIAGNOSIS — C61 Malignant neoplasm of prostate: Secondary | ICD-10-CM | POA: Diagnosis not present

## 2021-10-21 DIAGNOSIS — Z510 Encounter for antineoplastic radiation therapy: Secondary | ICD-10-CM | POA: Diagnosis not present

## 2021-10-21 DIAGNOSIS — Z79818 Long term (current) use of other agents affecting estrogen receptors and estrogen levels: Secondary | ICD-10-CM | POA: Diagnosis not present

## 2021-10-21 DIAGNOSIS — C61 Malignant neoplasm of prostate: Secondary | ICD-10-CM | POA: Diagnosis not present

## 2021-10-22 DIAGNOSIS — Z510 Encounter for antineoplastic radiation therapy: Secondary | ICD-10-CM | POA: Diagnosis not present

## 2021-10-22 DIAGNOSIS — C61 Malignant neoplasm of prostate: Secondary | ICD-10-CM | POA: Diagnosis not present

## 2021-10-22 DIAGNOSIS — Z79818 Long term (current) use of other agents affecting estrogen receptors and estrogen levels: Secondary | ICD-10-CM | POA: Diagnosis not present

## 2021-10-24 DIAGNOSIS — Z510 Encounter for antineoplastic radiation therapy: Secondary | ICD-10-CM | POA: Diagnosis not present

## 2021-10-24 DIAGNOSIS — C61 Malignant neoplasm of prostate: Secondary | ICD-10-CM | POA: Diagnosis not present

## 2021-10-24 DIAGNOSIS — Z79818 Long term (current) use of other agents affecting estrogen receptors and estrogen levels: Secondary | ICD-10-CM | POA: Diagnosis not present

## 2021-10-25 DIAGNOSIS — Z79818 Long term (current) use of other agents affecting estrogen receptors and estrogen levels: Secondary | ICD-10-CM | POA: Diagnosis not present

## 2021-10-25 DIAGNOSIS — C61 Malignant neoplasm of prostate: Secondary | ICD-10-CM | POA: Diagnosis not present

## 2021-10-25 DIAGNOSIS — Z510 Encounter for antineoplastic radiation therapy: Secondary | ICD-10-CM | POA: Diagnosis not present

## 2021-10-28 DIAGNOSIS — C61 Malignant neoplasm of prostate: Secondary | ICD-10-CM | POA: Diagnosis not present

## 2021-10-28 DIAGNOSIS — N5201 Erectile dysfunction due to arterial insufficiency: Secondary | ICD-10-CM | POA: Diagnosis not present

## 2021-10-28 DIAGNOSIS — Z510 Encounter for antineoplastic radiation therapy: Secondary | ICD-10-CM | POA: Diagnosis not present

## 2021-10-28 DIAGNOSIS — Z79818 Long term (current) use of other agents affecting estrogen receptors and estrogen levels: Secondary | ICD-10-CM | POA: Diagnosis not present

## 2021-10-29 DIAGNOSIS — Z510 Encounter for antineoplastic radiation therapy: Secondary | ICD-10-CM | POA: Diagnosis not present

## 2021-10-29 DIAGNOSIS — Z79818 Long term (current) use of other agents affecting estrogen receptors and estrogen levels: Secondary | ICD-10-CM | POA: Diagnosis not present

## 2021-10-29 DIAGNOSIS — C61 Malignant neoplasm of prostate: Secondary | ICD-10-CM | POA: Diagnosis not present

## 2021-10-30 DIAGNOSIS — Z510 Encounter for antineoplastic radiation therapy: Secondary | ICD-10-CM | POA: Diagnosis not present

## 2021-10-30 DIAGNOSIS — Z79818 Long term (current) use of other agents affecting estrogen receptors and estrogen levels: Secondary | ICD-10-CM | POA: Diagnosis not present

## 2021-10-30 DIAGNOSIS — C61 Malignant neoplasm of prostate: Secondary | ICD-10-CM | POA: Diagnosis not present

## 2021-10-31 DIAGNOSIS — Z510 Encounter for antineoplastic radiation therapy: Secondary | ICD-10-CM | POA: Diagnosis not present

## 2021-10-31 DIAGNOSIS — C61 Malignant neoplasm of prostate: Secondary | ICD-10-CM | POA: Diagnosis not present

## 2021-10-31 DIAGNOSIS — Z79818 Long term (current) use of other agents affecting estrogen receptors and estrogen levels: Secondary | ICD-10-CM | POA: Diagnosis not present

## 2021-11-01 DIAGNOSIS — Z79818 Long term (current) use of other agents affecting estrogen receptors and estrogen levels: Secondary | ICD-10-CM | POA: Diagnosis not present

## 2021-11-01 DIAGNOSIS — C61 Malignant neoplasm of prostate: Secondary | ICD-10-CM | POA: Diagnosis not present

## 2021-11-01 DIAGNOSIS — Z510 Encounter for antineoplastic radiation therapy: Secondary | ICD-10-CM | POA: Diagnosis not present

## 2021-11-05 DIAGNOSIS — C61 Malignant neoplasm of prostate: Secondary | ICD-10-CM | POA: Diagnosis not present

## 2021-11-05 DIAGNOSIS — Z510 Encounter for antineoplastic radiation therapy: Secondary | ICD-10-CM | POA: Diagnosis not present

## 2021-11-05 DIAGNOSIS — Z79818 Long term (current) use of other agents affecting estrogen receptors and estrogen levels: Secondary | ICD-10-CM | POA: Diagnosis not present

## 2021-11-06 DIAGNOSIS — Z510 Encounter for antineoplastic radiation therapy: Secondary | ICD-10-CM | POA: Diagnosis not present

## 2021-11-06 DIAGNOSIS — C61 Malignant neoplasm of prostate: Secondary | ICD-10-CM | POA: Diagnosis not present

## 2021-11-06 DIAGNOSIS — Z79818 Long term (current) use of other agents affecting estrogen receptors and estrogen levels: Secondary | ICD-10-CM | POA: Diagnosis not present

## 2021-11-07 DIAGNOSIS — Z510 Encounter for antineoplastic radiation therapy: Secondary | ICD-10-CM | POA: Diagnosis not present

## 2021-11-07 DIAGNOSIS — C61 Malignant neoplasm of prostate: Secondary | ICD-10-CM | POA: Diagnosis not present

## 2021-11-28 DIAGNOSIS — D225 Melanocytic nevi of trunk: Secondary | ICD-10-CM | POA: Diagnosis not present

## 2021-11-28 DIAGNOSIS — L570 Actinic keratosis: Secondary | ICD-10-CM | POA: Diagnosis not present

## 2021-11-28 DIAGNOSIS — L821 Other seborrheic keratosis: Secondary | ICD-10-CM | POA: Diagnosis not present

## 2021-11-28 DIAGNOSIS — D1801 Hemangioma of skin and subcutaneous tissue: Secondary | ICD-10-CM | POA: Diagnosis not present

## 2021-11-28 DIAGNOSIS — B354 Tinea corporis: Secondary | ICD-10-CM | POA: Diagnosis not present

## 2021-11-29 ENCOUNTER — Encounter: Payer: Self-pay | Admitting: Dermatology

## 2021-12-03 DIAGNOSIS — Z510 Encounter for antineoplastic radiation therapy: Secondary | ICD-10-CM | POA: Diagnosis not present

## 2021-12-03 DIAGNOSIS — C61 Malignant neoplasm of prostate: Secondary | ICD-10-CM | POA: Diagnosis not present

## 2022-01-17 DIAGNOSIS — Z130 Encounter for screening for diseases of the blood and blood-forming organs and certain disorders involving the immune mechanism: Secondary | ICD-10-CM | POA: Diagnosis not present

## 2022-01-17 DIAGNOSIS — R635 Abnormal weight gain: Secondary | ICD-10-CM | POA: Diagnosis not present

## 2022-01-17 DIAGNOSIS — Z131 Encounter for screening for diabetes mellitus: Secondary | ICD-10-CM | POA: Diagnosis not present

## 2022-01-17 DIAGNOSIS — E291 Testicular hypofunction: Secondary | ICD-10-CM | POA: Diagnosis not present

## 2022-01-17 DIAGNOSIS — E782 Mixed hyperlipidemia: Secondary | ICD-10-CM | POA: Diagnosis not present

## 2022-01-17 DIAGNOSIS — Z1329 Encounter for screening for other suspected endocrine disorder: Secondary | ICD-10-CM | POA: Diagnosis not present

## 2022-01-17 DIAGNOSIS — Z8546 Personal history of malignant neoplasm of prostate: Secondary | ICD-10-CM | POA: Diagnosis not present

## 2022-01-21 DIAGNOSIS — Z130 Encounter for screening for diseases of the blood and blood-forming organs and certain disorders involving the immune mechanism: Secondary | ICD-10-CM | POA: Diagnosis not present

## 2022-01-21 DIAGNOSIS — E291 Testicular hypofunction: Secondary | ICD-10-CM | POA: Diagnosis not present

## 2022-01-21 DIAGNOSIS — N529 Male erectile dysfunction, unspecified: Secondary | ICD-10-CM | POA: Diagnosis not present

## 2022-01-21 DIAGNOSIS — E7800 Pure hypercholesterolemia, unspecified: Secondary | ICD-10-CM | POA: Diagnosis not present

## 2022-01-21 DIAGNOSIS — R7303 Prediabetes: Secondary | ICD-10-CM | POA: Diagnosis not present

## 2022-01-21 DIAGNOSIS — Z0000 Encounter for general adult medical examination without abnormal findings: Secondary | ICD-10-CM | POA: Diagnosis not present

## 2022-01-31 DIAGNOSIS — Z1339 Encounter for screening examination for other mental health and behavioral disorders: Secondary | ICD-10-CM | POA: Diagnosis not present

## 2022-01-31 DIAGNOSIS — Z8546 Personal history of malignant neoplasm of prostate: Secondary | ICD-10-CM | POA: Diagnosis not present

## 2022-01-31 DIAGNOSIS — E291 Testicular hypofunction: Secondary | ICD-10-CM | POA: Diagnosis not present

## 2022-01-31 DIAGNOSIS — Z6831 Body mass index (BMI) 31.0-31.9, adult: Secondary | ICD-10-CM | POA: Diagnosis not present

## 2022-01-31 DIAGNOSIS — E782 Mixed hyperlipidemia: Secondary | ICD-10-CM | POA: Diagnosis not present

## 2022-01-31 DIAGNOSIS — Z1331 Encounter for screening for depression: Secondary | ICD-10-CM | POA: Diagnosis not present

## 2022-02-03 DIAGNOSIS — E782 Mixed hyperlipidemia: Secondary | ICD-10-CM | POA: Diagnosis not present

## 2022-02-03 DIAGNOSIS — R37 Sexual dysfunction, unspecified: Secondary | ICD-10-CM | POA: Diagnosis not present

## 2022-02-13 DIAGNOSIS — C61 Malignant neoplasm of prostate: Secondary | ICD-10-CM | POA: Diagnosis not present

## 2022-02-13 DIAGNOSIS — N5201 Erectile dysfunction due to arterial insufficiency: Secondary | ICD-10-CM | POA: Diagnosis not present

## 2022-03-04 DIAGNOSIS — D225 Melanocytic nevi of trunk: Secondary | ICD-10-CM | POA: Diagnosis not present

## 2022-03-04 DIAGNOSIS — L570 Actinic keratosis: Secondary | ICD-10-CM | POA: Diagnosis not present

## 2022-03-04 DIAGNOSIS — L578 Other skin changes due to chronic exposure to nonionizing radiation: Secondary | ICD-10-CM | POA: Diagnosis not present

## 2022-03-04 DIAGNOSIS — D2371 Other benign neoplasm of skin of right lower limb, including hip: Secondary | ICD-10-CM | POA: Diagnosis not present

## 2022-03-04 DIAGNOSIS — L820 Inflamed seborrheic keratosis: Secondary | ICD-10-CM | POA: Diagnosis not present

## 2022-03-04 DIAGNOSIS — L821 Other seborrheic keratosis: Secondary | ICD-10-CM | POA: Diagnosis not present

## 2022-04-29 DIAGNOSIS — E782 Mixed hyperlipidemia: Secondary | ICD-10-CM | POA: Diagnosis not present

## 2022-04-29 DIAGNOSIS — Z6830 Body mass index (BMI) 30.0-30.9, adult: Secondary | ICD-10-CM | POA: Diagnosis not present

## 2022-05-14 DIAGNOSIS — C61 Malignant neoplasm of prostate: Secondary | ICD-10-CM | POA: Diagnosis not present

## 2022-05-15 DIAGNOSIS — C61 Malignant neoplasm of prostate: Secondary | ICD-10-CM | POA: Diagnosis not present

## 2022-05-16 DIAGNOSIS — E782 Mixed hyperlipidemia: Secondary | ICD-10-CM | POA: Diagnosis not present

## 2022-05-16 DIAGNOSIS — Z6830 Body mass index (BMI) 30.0-30.9, adult: Secondary | ICD-10-CM | POA: Diagnosis not present

## 2022-05-16 DIAGNOSIS — F5101 Primary insomnia: Secondary | ICD-10-CM | POA: Diagnosis not present

## 2022-07-01 DIAGNOSIS — C44712 Basal cell carcinoma of skin of right lower limb, including hip: Secondary | ICD-10-CM | POA: Diagnosis not present

## 2022-07-01 DIAGNOSIS — C4441 Basal cell carcinoma of skin of scalp and neck: Secondary | ICD-10-CM | POA: Diagnosis not present

## 2022-07-01 DIAGNOSIS — L570 Actinic keratosis: Secondary | ICD-10-CM | POA: Diagnosis not present

## 2022-07-03 DIAGNOSIS — G4733 Obstructive sleep apnea (adult) (pediatric): Secondary | ICD-10-CM | POA: Diagnosis not present

## 2022-07-10 DIAGNOSIS — E782 Mixed hyperlipidemia: Secondary | ICD-10-CM | POA: Diagnosis not present

## 2022-07-10 DIAGNOSIS — Z6830 Body mass index (BMI) 30.0-30.9, adult: Secondary | ICD-10-CM | POA: Diagnosis not present

## 2022-07-10 DIAGNOSIS — F5101 Primary insomnia: Secondary | ICD-10-CM | POA: Diagnosis not present

## 2022-07-11 DIAGNOSIS — G4733 Obstructive sleep apnea (adult) (pediatric): Secondary | ICD-10-CM | POA: Diagnosis not present

## 2022-08-03 DIAGNOSIS — G4733 Obstructive sleep apnea (adult) (pediatric): Secondary | ICD-10-CM | POA: Diagnosis not present

## 2022-08-08 ENCOUNTER — Ambulatory Visit (INDEPENDENT_AMBULATORY_CARE_PROVIDER_SITE_OTHER): Payer: BC Managed Care – PPO | Admitting: Podiatry

## 2022-08-08 ENCOUNTER — Ambulatory Visit (INDEPENDENT_AMBULATORY_CARE_PROVIDER_SITE_OTHER): Payer: BC Managed Care – PPO

## 2022-08-08 VITALS — BP 127/79

## 2022-08-08 DIAGNOSIS — M2041 Other hammer toe(s) (acquired), right foot: Secondary | ICD-10-CM

## 2022-08-08 DIAGNOSIS — M2012 Hallux valgus (acquired), left foot: Secondary | ICD-10-CM | POA: Diagnosis not present

## 2022-08-08 DIAGNOSIS — Q66221 Congenital metatarsus adductus, right foot: Secondary | ICD-10-CM | POA: Diagnosis not present

## 2022-08-08 DIAGNOSIS — M2011 Hallux valgus (acquired), right foot: Secondary | ICD-10-CM

## 2022-08-08 NOTE — Patient Instructions (Addendum)
[facility] and Adductoplasty.   [email or web address]

## 2022-08-08 NOTE — Progress Notes (Signed)
Subjective: 61 y.o. male presenting today established to the practice but not seen since 2020 presenting for symptomatic bunions to the bilateral feet that is been a gradual onset for several years.  Patient states he is interested in surgery.  Conservative treatments have been unsuccessful including shoe gear modifications.  Patient is very active and healthy but he experiences a significant amount of pain and it affects his quality of life and daily activities.  He plays tennis and walks but the pain of the bunions is becoming somewhat debilitating.  He presents for further treatment and evaluation  Past Medical History:  Diagnosis Date   Biceps tendon rupture    left   H/O seasonal allergies    Hypercholesterolemia    Nodular basal cell carcinoma (BCC) 05/04/2018   Right Lower Leg (MOH's)   OA (osteoarthritis)    BL hips   Prostate cancer ([place]) 02/13/2021   stage 3C   Sleep apnea    wears CPAP    Superficial basal cell carcinoma (BCC) 08/26/2011   Right Upper Back (never treated)   Wears contact lenses     Objective: Physical Exam General: The patient is alert and oriented x3 in no acute distress.  Dermatology: Skin is cool, dry and supple bilateral lower extremities. Negative for open lesions or macerations.  Vascular: Palpable pedal pulses bilaterally. No edema or erythema noted. Capillary refill within normal limits.  Neurological: Epicritic and protective threshold grossly intact bilaterally.   Musculoskeletal Exam: Clinical evidence of bunion deformity noted to the respective foot. There is moderate pain on palpation range of motion of the first MPJ. Lateral deviation of the hallux noted consistent with hallux abductovalgus. Hammertoe contracture also noted on clinical exam to digits #2 of the right foot. Symptomatic pain on palpation and range of motion also noted to the metatarsal phalangeal joints of the respective hammertoe digits.    Radiographic Exam: Increased  intermetatarsal angle greater than 15 with a hallux abductus angle greater than 30 noted on AP view. Moderate degenerative changes noted within the first MPJ. Contracture deformity also noted to the interphalangeal joints and MPJs of the digits of the respective hammertoes. Shortening with degenerative changes noted to the second MTPJ of the left foot with a small orthopedic screw embedded within the head of the second metatarsal.    Assessment: 1. HAV w/ bunion deformity bilateral.  RT > LT 2. Hammertoe deformity 2nd RT  3. H/o hammertoe repair 2nd LT by Dr. Sharol Given   Plan of Care:  1. Patient was evaluated. X-Rays reviewed. 2. Today we discussed in detail the conservative versus surgical management of the presenting pathology.  We also discussed different surgical options including Lapidus versus double osteotomy to correct for the bunion deformity.  I do not believe that an isolated Lapidus would correct for the entire bunion and an Aiken osteotomy would likely need to be performed as well which would include a long linear incision over the foot.  I feel I would be able to achieve good correction and alignment of the first ray by performing a double osteotomy including a long-arm Austin and Akin osteotomy within the small incision site.  The patient opts for surgical management. All possible complications and details of the procedure were explained. All patient questions were answered. No guarantees were expressed or implied. 3. Authorization for surgery will be initiated upon the patient's return in January 2023. Surgery will consist of bunionectomy with double osteotomy right.  PIPJ arthroplasty with MTPJ capsulotomy and Weil  shortening osteotomy right second digit 4.  Patient would like to have surgery in February 2023 after returning from a [place] trip.  Recommend that the patient return to the clinic January 2023 for surgical consult and scheduling  *Manages doctor  practices     Edrick Kins, DPM [facility]  Dr. Edrick Kins, [address]                                        [place], [place] [address]                Office [phone]  Fax [phone]

## 2022-08-09 ENCOUNTER — Encounter: Payer: Self-pay | Admitting: Podiatry

## 2022-08-09 DIAGNOSIS — G4733 Obstructive sleep apnea (adult) (pediatric): Secondary | ICD-10-CM | POA: Diagnosis not present

## 2022-09-01 DIAGNOSIS — G4733 Obstructive sleep apnea (adult) (pediatric): Secondary | ICD-10-CM | POA: Diagnosis not present

## 2022-09-05 DIAGNOSIS — J329 Chronic sinusitis, unspecified: Secondary | ICD-10-CM | POA: Diagnosis not present

## 2022-09-05 DIAGNOSIS — R918 Other nonspecific abnormal finding of lung field: Secondary | ICD-10-CM | POA: Diagnosis not present

## 2022-09-05 DIAGNOSIS — M791 Myalgia, unspecified site: Secondary | ICD-10-CM | POA: Diagnosis not present

## 2022-09-05 DIAGNOSIS — G629 Polyneuropathy, unspecified: Secondary | ICD-10-CM | POA: Diagnosis not present

## 2022-09-05 DIAGNOSIS — H906 Mixed conductive and sensorineural hearing loss, bilateral: Secondary | ICD-10-CM | POA: Diagnosis not present

## 2022-09-05 DIAGNOSIS — R768 Other specified abnormal immunological findings in serum: Secondary | ICD-10-CM | POA: Diagnosis not present

## 2022-09-09 DIAGNOSIS — G4733 Obstructive sleep apnea (adult) (pediatric): Secondary | ICD-10-CM | POA: Diagnosis not present

## 2022-09-09 DIAGNOSIS — I7782 Antineutrophilic cytoplasmic antibody (ANCA) vasculitis: Secondary | ICD-10-CM | POA: Diagnosis not present

## 2022-09-10 DIAGNOSIS — I7782 Antineutrophilic cytoplasmic antibody (ANCA) vasculitis: Secondary | ICD-10-CM | POA: Diagnosis not present

## 2022-09-11 DIAGNOSIS — I7782 Antineutrophilic cytoplasmic antibody (ANCA) vasculitis: Secondary | ICD-10-CM | POA: Diagnosis not present

## 2022-09-17 DIAGNOSIS — M79604 Pain in right leg: Secondary | ICD-10-CM | POA: Diagnosis not present

## 2022-09-17 DIAGNOSIS — R269 Unspecified abnormalities of gait and mobility: Secondary | ICD-10-CM | POA: Diagnosis not present

## 2022-09-17 DIAGNOSIS — R29898 Other symptoms and signs involving the musculoskeletal system: Secondary | ICD-10-CM | POA: Diagnosis not present

## 2022-09-17 DIAGNOSIS — M79605 Pain in left leg: Secondary | ICD-10-CM | POA: Diagnosis not present

## 2022-09-23 DIAGNOSIS — J310 Chronic rhinitis: Secondary | ICD-10-CM | POA: Diagnosis not present

## 2022-09-23 DIAGNOSIS — M317 Microscopic polyangiitis: Secondary | ICD-10-CM | POA: Diagnosis not present

## 2022-09-23 DIAGNOSIS — H906 Mixed conductive and sensorineural hearing loss, bilateral: Secondary | ICD-10-CM | POA: Diagnosis not present

## 2022-09-24 DIAGNOSIS — R911 Solitary pulmonary nodule: Secondary | ICD-10-CM | POA: Diagnosis not present

## 2022-09-24 DIAGNOSIS — R918 Other nonspecific abnormal finding of lung field: Secondary | ICD-10-CM | POA: Diagnosis not present

## 2022-09-24 DIAGNOSIS — J209 Acute bronchitis, unspecified: Secondary | ICD-10-CM | POA: Diagnosis not present

## 2022-09-28 ENCOUNTER — Encounter: Payer: Self-pay | Admitting: Podiatry

## 2022-09-28 DIAGNOSIS — I471 Supraventricular tachycardia, unspecified: Secondary | ICD-10-CM | POA: Diagnosis not present

## 2022-09-28 DIAGNOSIS — I491 Atrial premature depolarization: Secondary | ICD-10-CM | POA: Diagnosis not present

## 2022-10-02 ENCOUNTER — Encounter: Payer: Self-pay | Admitting: Orthopedic Surgery

## 2022-10-09 DIAGNOSIS — G4733 Obstructive sleep apnea (adult) (pediatric): Secondary | ICD-10-CM | POA: Diagnosis not present

## 2022-10-13 ENCOUNTER — Other Ambulatory Visit (HOSPITAL_BASED_OUTPATIENT_CLINIC_OR_DEPARTMENT_OTHER): Payer: Self-pay

## 2022-10-13 ENCOUNTER — Other Ambulatory Visit: Payer: Self-pay

## 2022-10-13 DIAGNOSIS — E782 Mixed hyperlipidemia: Secondary | ICD-10-CM | POA: Diagnosis not present

## 2022-10-13 DIAGNOSIS — G4730 Sleep apnea, unspecified: Secondary | ICD-10-CM | POA: Diagnosis not present

## 2022-10-13 DIAGNOSIS — Z6830 Body mass index (BMI) 30.0-30.9, adult: Secondary | ICD-10-CM | POA: Diagnosis not present

## 2022-10-13 MED ORDER — ZEPBOUND 15 MG/0.5ML ~~LOC~~ SOAJ
15.0000 mg | SUBCUTANEOUS | 1 refills | Status: DC
  Filled 2022-10-13: qty 2, 28d supply, fill #0
  Filled 2022-11-01 – 2022-11-04 (×2): qty 2, 28d supply, fill #1

## 2022-10-14 ENCOUNTER — Ambulatory Visit (INDEPENDENT_AMBULATORY_CARE_PROVIDER_SITE_OTHER): Payer: BC Managed Care – PPO | Admitting: Orthopedic Surgery

## 2022-10-14 DIAGNOSIS — M205X1 Other deformities of toe(s) (acquired), right foot: Secondary | ICD-10-CM

## 2022-10-14 DIAGNOSIS — M2021 Hallux rigidus, right foot: Secondary | ICD-10-CM

## 2022-10-20 DIAGNOSIS — I7782 Antineutrophilic cytoplasmic antibody (ANCA) vasculitis: Secondary | ICD-10-CM | POA: Diagnosis not present

## 2022-10-20 DIAGNOSIS — R7401 Elevation of levels of liver transaminase levels: Secondary | ICD-10-CM | POA: Diagnosis not present

## 2022-10-28 ENCOUNTER — Encounter: Payer: Self-pay | Admitting: Orthopedic Surgery

## 2022-10-28 NOTE — Progress Notes (Signed)
Office Visit Note   Patient: [name] [name]           Date of Birth: 1961-01-27           MRN: [id number] Visit Date: 10/14/2022              Requested by: No referring provider defined for this encounter. PCP: Elias Else, MD (Inactive)  Chief Complaint  Patient presents with   Right Foot - Pain      HPI: Patient is a 62 year old gentleman with hallux rigidus and clawing of the second toe right foot.  Patient has failed conservative therapy and would like to consider surgical intervention.  Assessment & Plan: Visit Diagnoses:  1. Hallux rigidus, right foot   2. Acquired claw toe, right     Plan: Patient would like to proceed with a fusion of the great toe MTP joint Weil osteotomy of the second metatarsal and PIP resection of the second toe with pinning.  Risks and benefits were discussed including infection neurovascular injury problems healing of the bone and soft tissue need for additional surgery.  Follow-Up Instructions: Return if symptoms worsen or fail to improve, for Follow-up after surgery.Gaylord Shih Exam  Patient is alert, oriented, no adenopathy, well-dressed, normal affect, normal respiratory effort. Examination patient is a strong dorsalis pedis pulse.  Radiographs show a intermetatarsal angle of 15 degrees and a hallux valgus angle of 50 degrees with cystic changes to the MTP joint with bony collapse and a noncongruent joint.  There is a long second metatarsal with fixed clawing of the PIP joint of the second toe.  Reviewed options including multiple osteotomies for bunion closure however with the arthritis of the MTP joint discussed that patient would most likely still be symptomatic.  Imaging: No results found. No images are attached to the encounter.  Labs: No results found for: "HGBA1C", "ESRSEDRATE", "CRP", "LABURIC", "REPTSTATUS", "GRAMSTAIN", "CULT", "LABORGA"   No results found for: "ALBUMIN", "PREALBUMIN", "CBC"  No results found for:  "MG" No results found for: "VD25OH"  No results found for: "PREALBUMIN"    Latest Ref Rng & Units 09/15/2017    6:45 AM 07/22/2007    4:45 AM 07/21/2007    4:50 AM  CBC EXTENDED  WBC 4.0 - 10.5 K/uL 4.8   5.9   RBC 4.22 - 5.81 MIL/uL 5.22   2.90   Hemoglobin 13.0 - 17.0 g/dL 40.9  8.6  8.9   HCT 81.1 - 52.0 % 45.6  24.4  25.3   Platelets 150 - 400 K/uL 195   171      There is no height or weight on file to calculate BMI.  Orders:  No orders of the defined types were placed in this encounter.  No orders of the defined types were placed in this encounter.    Procedures: No procedures performed  Clinical Data: No additional findings.  ROS:  All other systems negative, except as noted in the HPI. Review of Systems  Objective: Vital Signs: There were no vitals taken for this visit.  Specialty Comments:  No specialty comments available.  PMFS History: Patient Active Problem List   Diagnosis Date Noted   Claw toe, acquired 04/14/2016   Hallux valgus, acquired 04/14/2016   Generalized hyperhidrosis 09/20/2015   Insomnia 09/20/2015   Male erectile dysfunction 09/20/2015   Pain in joint 09/20/2015   Pure hypercholesterolemia 09/20/2015   Testicular hypofunction 09/20/2015   Allergic rhinitis 09/13/2014   Overweight (BMI 25.0-29.9) 09/13/2014  Sleep apnea, obstructive 09/13/2014   Past Medical History:  Diagnosis Date   Biceps tendon rupture    left   H/O seasonal allergies    Hypercholesterolemia    Nodular basal cell carcinoma (BCC) 05/04/2018   Right Lower Leg (MOH's)   OA (osteoarthritis)    BL hips   Prostate cancer ([facility]) 02/13/2021   stage 3C   Sleep apnea    wears CPAP    Superficial basal cell carcinoma (BCC) 08/26/2011   Right Upper Back (never treated)   Wears contact lenses     Family History  Problem Relation Age of Onset   Alzheimer's disease Mother    Diabetes Father    Hypercholesterolemia Father     Past Surgical History:  Procedure  Laterality Date   APPENDECTOMY     COLONOSCOPY     DISTAL BICEPS TENDON REPAIR Left 09/15/2017   Procedure: LEFT DISTAL BICEPS TENDON REPAIR;  Surgeon: Cammy Copa, MD;  Location: [facility] OR;  Service: Orthopedics;  Laterality: Left;   FOOT SURGERY     left foot   JOINT REPLACEMENT     B/L hips   TONSILLECTOMY     TONSILLECTOMY AND ADENOIDECTOMY     WISDOM TOOTH EXTRACTION     Social History   Occupational History   Not on file  Tobacco Use   Smoking status: Never   Smokeless tobacco: Never  Vaping Use   Vaping Use: Never used  Substance and Sexual Activity   Alcohol use: Yes    Comment: social   Drug use: Never   Sexual activity: Not on file

## 2022-11-01 ENCOUNTER — Other Ambulatory Visit (HOSPITAL_BASED_OUTPATIENT_CLINIC_OR_DEPARTMENT_OTHER): Payer: Self-pay

## 2022-11-04 ENCOUNTER — Telehealth: Payer: Self-pay | Admitting: Urology

## 2022-11-04 ENCOUNTER — Other Ambulatory Visit (HOSPITAL_BASED_OUTPATIENT_CLINIC_OR_DEPARTMENT_OTHER): Payer: Self-pay

## 2022-11-04 NOTE — Telephone Encounter (Signed)
DOS - 11/27/22  LAPIDUS PROCEDURE INCLUDING BUNIONECTOMY RIGHT --- 29562 ARTHRODESIS LIS FRANC 2,3 RIGHT --- 13086 HAMMERTOE REPAIR 2 RIGHT --- 57846  BCBS EFFECTIVE DATE - 06/09/21  DEDUCTIBLE - $3,200.00 W/ $1,609.00 REMAINING OOP - $4,000.00 W/ $0.00 REMAINING COINSURANCE - 0%  SPOKE WITH HAZEL WITH [employer] AND SHE STATED THAT FOR CPT CODE 96295 NO PRIOR AUTH IS REQUIRED. FOR CPT CODES 28413 AND [phone] NEED TO BE SUBMITTED THROUGH [facility].  CALL REF # I - [id number]  PER [employer] WEBSITE FOR CPT CODES 44034 AND 28297 HAVE BEEN APPROVED, AUTH # [id number], GOOD FROM 11/27/22 - 01/25/23.

## 2022-11-05 ENCOUNTER — Telehealth: Payer: Self-pay

## 2022-11-05 DIAGNOSIS — I739 Peripheral vascular disease, unspecified: Secondary | ICD-10-CM | POA: Diagnosis not present

## 2022-11-05 NOTE — Telephone Encounter (Signed)
[name] [name] called to cancel his surgery with Dr. Logan Bores on 11/27/2022. He stated he really needed to have his surgery on 11/20/2022 but Dr. Logan Bores is out that week. He is having Dr. Lajoyce Corners do the surgery. Notified Dr. Logan Bores and Aram Beecham at [facility].

## 2022-11-06 DIAGNOSIS — E782 Mixed hyperlipidemia: Secondary | ICD-10-CM | POA: Diagnosis not present

## 2022-11-06 DIAGNOSIS — Z6830 Body mass index (BMI) 30.0-30.9, adult: Secondary | ICD-10-CM | POA: Diagnosis not present

## 2022-11-09 DIAGNOSIS — G4733 Obstructive sleep apnea (adult) (pediatric): Secondary | ICD-10-CM | POA: Diagnosis not present

## 2022-11-17 DIAGNOSIS — I7782 Antineutrophilic cytoplasmic antibody (ANCA) vasculitis: Secondary | ICD-10-CM | POA: Diagnosis not present

## 2022-11-17 DIAGNOSIS — R7401 Elevation of levels of liver transaminase levels: Secondary | ICD-10-CM | POA: Diagnosis not present

## 2022-11-18 ENCOUNTER — Other Ambulatory Visit: Payer: Self-pay

## 2022-11-18 ENCOUNTER — Encounter (HOSPITAL_COMMUNITY): Payer: Self-pay | Admitting: Orthopedic Surgery

## 2022-11-18 NOTE — Progress Notes (Addendum)
PCP - Tally Joe, MD Cardiologist - denies  PPM/ICD - denies  Chest x-ray - 12/03/21  CPAP - yes  Fasting Blood Sugar - n/a GLP - Tirzepatide - last dose - 11/16/22  Blood Thinner Instructions: n/a Aspirin Instructions - patient states that he didn't have Aspirin in the last week Patient was instructed: As of today, STOP taking any Aspirin (unless otherwise instructed by your surgeon) Aleve, Naproxen, Ibuprofen, Motrin, Advil, Goody's, BC's, all herbal medications, fish oil, and all vitamins.  ERAS Protcol - yes, until 05:30 o'clock  COVID TEST- n/a  Anesthesia review: no  Patient verbally denies any shortness of breath, fever, cough and chest pain during phone call   -------------  SDW INSTRUCTIONS given:  Your procedure is scheduled on Wednesday, June 12th, 2024.  Report to [facility] Main Entrance "A" at 06:30 A.M., and check in at the Admitting office.  Call this number if you have problems the morning of surgery:  [phone]   Remember:  Do not eat after midnight the night before your surgery  You may drink clear liquids until 05:30 the morning of your surgery.   Clear liquids allowed are: Water, Non-Citrus Juices (without pulp), Carbonated Beverages, Clear Tea, Black Coffee Only, and Gatorade    Take these medicines the morning of surgery with A SIP OF WATER:  Zyrtec, Crestor, Flomax PRN: Tylenol  Patient was instructed to ask MD if he must hold relugolix (ORGOVYX) prior to surgery   The day of surgery:                     Do not wear jewelry,             Do not wear lotions, powders, colognes, or deodorant.            Men may shave face and neck.            Do not bring valuables to the hospital.            [facility] is not responsible for any belongings or valuables.  Do NOT Smoke (Tobacco/Vaping) 24 hours prior to your procedure If you use a CPAP at night, you may bring all equipment for your overnight stay.   Contacts, glasses, dentures or  bridgework may not be worn into surgery.      For patients admitted to the hospital, discharge time will be determined by your treatment team.   Patients discharged the day of surgery will not be allowed to drive home, and someone needs to stay with them for 24 hours.    Special instructions:   [place]- Preparing For Surgery  Before surgery, you can play an important role. Because skin is not sterile, your skin needs to be as free of germs as possible. You can reduce the number of germs on your skin by washing with CHG (chlorahexidine gluconate) Soap before surgery.  CHG is an antiseptic cleaner which kills germs and bonds with the skin to continue killing germs even after washing.    Oral Hygiene is also important to reduce your risk of infection.  Remember - BRUSH YOUR TEETH THE MORNING OF SURGERY WITH YOUR REGULAR TOOTHPASTE  Please do not use if you have an allergy to CHG or antibacterial soaps. If your skin becomes reddened/irritated stop using the CHG.  Do not shave (including legs and underarms) for at least 48 hours prior to first CHG shower. It is OK to shave your face.  Please follow these  instructions carefully.   Shower the NIGHT BEFORE SURGERY and the MORNING OF SURGERY with DIAL Soap.   Pat yourself dry with a CLEAN TOWEL.  Wear CLEAN PAJAMAS to bed the night before surgery  Place CLEAN SHEETS on your bed the night of your first shower and DO NOT SLEEP WITH PETS.   Day of Surgery: Please shower morning of surgery  Wear Clean/Comfortable clothing the morning of surgery Do not apply any deodorants/lotions.   Remember to brush your teeth WITH YOUR REGULAR TOOTHPASTE.   Questions were answered. Patient verbalized understanding of instructions.

## 2022-11-18 NOTE — Anesthesia Preprocedure Evaluation (Signed)
Anesthesia Evaluation  Patient identified by MRN, date of birth, ID band Patient awake    Reviewed: Allergy & Precautions, NPO status , Patient's Chart, lab work & pertinent test results  Airway Mallampati: III  TM Distance: >3 FB Neck ROM: Full    Dental  (+) Teeth Intact, Dental Advisory Given   Pulmonary sleep apnea and Continuous Positive Airway Pressure Ventilation    Pulmonary exam normal breath sounds clear to auscultation       Cardiovascular negative cardio ROS Normal cardiovascular exam Rhythm:Regular Rate:Normal  HLD   Neuro/Psych negative neurological ROS  negative psych ROS   GI/Hepatic negative GI ROS, Neg liver ROS,,,  Endo/Other  negative endocrine ROS    Renal/GU negative Renal ROS  negative genitourinary   Musculoskeletal  (+) Arthritis ,    Abdominal   Peds  Hematology negative hematology ROS (+)   Anesthesia Other Findings   Reproductive/Obstetrics                             Anesthesia Physical Anesthesia Plan  ASA: 2  Anesthesia Plan: General   Post-op Pain Management: Regional block* and Tylenol PO (pre-op)*   Induction: Intravenous  PONV Risk Score and Plan: 2 and Ondansetron, Dexamethasone and Treatment may vary due to age or medical condition  Airway Management Planned: LMA  Additional Equipment:   Intra-op Plan:   Post-operative Plan: Extubation in OR  Informed Consent: I have reviewed the patients History and Physical, chart, labs and discussed the procedure including the risks, benefits and alternatives for the proposed anesthesia with the patient or authorized representative who has indicated his/her understanding and acceptance.     Dental advisory given  Plan Discussed with: CRNA  Anesthesia Plan Comments:        Anesthesia Quick Evaluation

## 2022-11-19 ENCOUNTER — Telehealth: Payer: Self-pay | Admitting: Orthopedic Surgery

## 2022-11-19 ENCOUNTER — Other Ambulatory Visit: Payer: Self-pay

## 2022-11-19 ENCOUNTER — Ambulatory Visit (HOSPITAL_COMMUNITY): Payer: BC Managed Care – PPO

## 2022-11-19 ENCOUNTER — Encounter (HOSPITAL_COMMUNITY): Payer: Self-pay | Admitting: Orthopedic Surgery

## 2022-11-19 ENCOUNTER — Encounter (HOSPITAL_COMMUNITY)
Admission: RE | Disposition: A | Discharge status: Home / Self Care | Payer: Self-pay | Source: Home / Self Care | Attending: Orthopedic Surgery

## 2022-11-19 ENCOUNTER — Ambulatory Visit (HOSPITAL_COMMUNITY)
Admission: RE | Admit: 2022-11-19 | Discharge: 2022-11-19 | Disposition: A | Discharge status: Home / Self Care | Payer: BC Managed Care – PPO | Attending: Orthopedic Surgery | Admitting: Orthopedic Surgery

## 2022-11-19 ENCOUNTER — Telehealth: Payer: Self-pay

## 2022-11-19 ENCOUNTER — Ambulatory Visit (HOSPITAL_COMMUNITY): Payer: BC Managed Care – PPO | Admitting: Certified Registered"

## 2022-11-19 DIAGNOSIS — M2021 Hallux rigidus, right foot: Secondary | ICD-10-CM

## 2022-11-19 DIAGNOSIS — G8918 Other acute postprocedural pain: Secondary | ICD-10-CM | POA: Diagnosis not present

## 2022-11-19 DIAGNOSIS — M205X1 Other deformities of toe(s) (acquired), right foot: Secondary | ICD-10-CM | POA: Diagnosis not present

## 2022-11-19 DIAGNOSIS — M2011 Hallux valgus (acquired), right foot: Secondary | ICD-10-CM

## 2022-11-19 DIAGNOSIS — E785 Hyperlipidemia, unspecified: Secondary | ICD-10-CM | POA: Diagnosis not present

## 2022-11-19 DIAGNOSIS — M19071 Primary osteoarthritis, right ankle and foot: Secondary | ICD-10-CM | POA: Diagnosis not present

## 2022-11-19 DIAGNOSIS — M7751 Other enthesopathy of right foot: Secondary | ICD-10-CM | POA: Insufficient documentation

## 2022-11-19 DIAGNOSIS — M869 Osteomyelitis, unspecified: Secondary | ICD-10-CM | POA: Diagnosis not present

## 2022-11-19 DIAGNOSIS — G4730 Sleep apnea, unspecified: Secondary | ICD-10-CM | POA: Diagnosis not present

## 2022-11-19 HISTORY — PX: ARTHRODESIS METATARSALPHALANGEAL JOINT (MTPJ): SHX6566

## 2022-11-19 LAB — BASIC METABOLIC PANEL
Anion gap: 13 (ref 5–15)
BUN: 22 mg/dL (ref 8–23)
CO2: 24 mmol/L (ref 22–32)
Calcium: 9.7 mg/dL (ref 8.9–10.3)
Chloride: 102 mmol/L (ref 98–111)
Creatinine, Ser: 0.7 mg/dL (ref 0.61–1.24)
GFR, Estimated: >60 mL/min (ref >60)
Glucose, Bld: 110 mg/dL — ABNORMAL HIGH (ref 70–99)
Potassium: 3.8 mmol/L (ref 3.5–5.1)
Sodium: 139 mmol/L (ref 135–145)

## 2022-11-19 LAB — CBC
HCT: 35.6 % — ABNORMAL LOW (ref 39.0–52.0)
Hemoglobin: 12 g/dL — ABNORMAL LOW (ref 13.0–17.0)
MCH: 29.5 pg (ref 26.0–34.0)
MCHC: 33.7 g/dL (ref 30.0–36.0)
MCV: 87.5 fL (ref 80.0–100.0)
Platelets: 204 10*3/uL (ref 150–400)
RBC: 4.07 MIL/uL — ABNORMAL LOW (ref 4.22–5.81)
RDW: 11.9 % (ref 11.5–15.5)
WBC: 3.8 10*3/uL — ABNORMAL LOW (ref 4.0–10.5)
nRBC: 0 % (ref 0.0–0.2)

## 2022-11-19 SURGERY — FUSION, JOINT, GREAT TOE
Anesthesia: General | Site: Toe | Laterality: Right

## 2022-11-19 MED ORDER — DEXAMETHASONE SODIUM PHOSPHATE 10 MG/ML IJ SOLN
INTRAMUSCULAR | Status: DC | PRN
  Administered 2022-11-19: 5 mg via INTRAVENOUS

## 2022-11-19 MED ORDER — LACTATED RINGERS IV SOLN: INTRAVENOUS | Status: DC | PRN

## 2022-11-19 MED ORDER — PROPOFOL 1000 MG/100ML IV EMUL
INTRAVENOUS | Status: AC
  Filled 2022-11-19: qty 100

## 2022-11-19 MED ORDER — CEFAZOLIN SODIUM-DEXTROSE 2-4 GM/100ML-% IV SOLN
2.0000 g | INTRAVENOUS | Status: AC
  Administered 2022-11-19: 2 g via INTRAVENOUS
  Filled 2022-11-19: qty 100

## 2022-11-19 MED ORDER — DEXAMETHASONE SODIUM PHOSPHATE 10 MG/ML IJ SOLN
INTRAMUSCULAR | Status: AC
  Filled 2022-11-19: qty 1

## 2022-11-19 MED ORDER — KETOROLAC TROMETHAMINE 30 MG/ML IJ SOLN
INTRAMUSCULAR | Status: AC
  Filled 2022-11-19: qty 1

## 2022-11-19 MED ORDER — LIDOCAINE 2% (20 MG/ML) 5 ML SYRINGE
INTRAMUSCULAR | Status: AC
  Filled 2022-11-19: qty 5

## 2022-11-19 MED ORDER — FENTANYL CITRATE (PF) 100 MCG/2ML IJ SOLN
INTRAMUSCULAR | Status: DC | PRN
  Administered 2022-11-19 (×2): 25 ug via INTRAVENOUS
  Administered 2022-11-19: 75 ug via INTRAVENOUS

## 2022-11-19 MED ORDER — KETOROLAC TROMETHAMINE 30 MG/ML IJ SOLN
INTRAMUSCULAR | Status: DC | PRN
  Administered 2022-11-19: 30 mg via INTRAVENOUS

## 2022-11-19 MED ORDER — PROPOFOL 10 MG/ML IV BOLUS
INTRAVENOUS | Status: DC | PRN
  Administered 2022-11-19: 100 mg via INTRAVENOUS
  Administered 2022-11-19: 200 mg via INTRAVENOUS

## 2022-11-19 MED ORDER — PROPOFOL 10 MG/ML IV BOLUS
INTRAVENOUS | Status: AC
  Filled 2022-11-19: qty 20

## 2022-11-19 MED ORDER — LIDOCAINE 2% (20 MG/ML) 5 ML SYRINGE
INTRAMUSCULAR | Status: DC | PRN
  Administered 2022-11-19: 20 mg via INTRAVENOUS

## 2022-11-19 MED ORDER — FENTANYL CITRATE (PF) 250 MCG/5ML IJ SOLN
INTRAMUSCULAR | Status: AC
  Filled 2022-11-19: qty 5

## 2022-11-19 MED ORDER — 0.9 % SODIUM CHLORIDE (POUR BTL) OPTIME
TOPICAL | Status: DC | PRN
  Administered 2022-11-19: 1000 mL

## 2022-11-19 MED ORDER — FENTANYL CITRATE (PF) 100 MCG/2ML IJ SOLN: 25.0000 ug | INTRAMUSCULAR | Status: DC | PRN

## 2022-11-19 MED ORDER — CEFAZOLIN SODIUM 1 G IJ SOLR
INTRAMUSCULAR | Status: AC
  Filled 2022-11-19: qty 20

## 2022-11-19 MED ORDER — DEXAMETHASONE SODIUM PHOSPHATE 10 MG/ML IJ SOLN
INTRAMUSCULAR | Status: DC | PRN
  Administered 2022-11-19 (×2): 5 mg

## 2022-11-19 MED ORDER — ONDANSETRON HCL 4 MG/2ML IJ SOLN
INTRAMUSCULAR | Status: AC
  Filled 2022-11-19: qty 2

## 2022-11-19 MED ORDER — LACTATED RINGERS IV SOLN: INTRAVENOUS | Status: DC

## 2022-11-19 MED ORDER — ACETAMINOPHEN 500 MG PO TABS
1000.0000 mg | ORAL_TABLET | Freq: Once | ORAL | Status: AC
  Administered 2022-11-19: 1000 mg via ORAL
  Filled 2022-11-19: qty 2

## 2022-11-19 MED ORDER — DEXMEDETOMIDINE HCL IN NACL 80 MCG/20ML IV SOLN
INTRAVENOUS | Status: AC
  Filled 2022-11-19: qty 20

## 2022-11-19 MED ORDER — ROPIVACAINE HCL 5 MG/ML IJ SOLN
INTRAMUSCULAR | Status: DC | PRN
  Administered 2022-11-19: 20 mL via PERINEURAL
  Administered 2022-11-19: 30 mL via PERINEURAL

## 2022-11-19 MED ORDER — PHENYLEPHRINE 80 MCG/ML (10ML) SYRINGE FOR IV PUSH (FOR BLOOD PRESSURE SUPPORT)
PREFILLED_SYRINGE | INTRAVENOUS | Status: AC
  Filled 2022-11-19: qty 10

## 2022-11-19 MED ORDER — PHENYLEPHRINE 80 MCG/ML (10ML) SYRINGE FOR IV PUSH (FOR BLOOD PRESSURE SUPPORT)
PREFILLED_SYRINGE | INTRAVENOUS | Status: DC | PRN
  Administered 2022-11-19: 80 ug via INTRAVENOUS
  Administered 2022-11-19: 40 ug via INTRAVENOUS

## 2022-11-19 MED ORDER — CHLORHEXIDINE GLUCONATE 0.12 % MT SOLN
15.0000 mL | Freq: Once | OROMUCOSAL | Status: AC
  Administered 2022-11-19: 15 mL via OROMUCOSAL
  Filled 2022-11-19: qty 15

## 2022-11-19 MED ORDER — OXYCODONE-ACETAMINOPHEN 5-325 MG PO TABS: 1.0000 | ORAL_TABLET | ORAL | 0 refills | Status: DC | PRN

## 2022-11-19 MED ORDER — ONDANSETRON HCL 4 MG/2ML IJ SOLN
INTRAMUSCULAR | Status: DC | PRN
  Administered 2022-11-19: 4 mg via INTRAVENOUS

## 2022-11-19 MED ORDER — ORAL CARE MOUTH RINSE: 15.0000 mL | Freq: Once | OROMUCOSAL | Status: AC

## 2022-11-19 SURGICAL SUPPLY — 53 items
APL SKNCLS STERI-STRIP NONHPOA (GAUZE/BANDAGES/DRESSINGS)
BAG COUNTER SPONGE SURGICOUNT (BAG) ×1 IMPLANT
BAG SPNG CNTER NS LX DISP (BAG) ×1
BENZOIN TINCTURE PRP APPL 2/3 (GAUZE/BANDAGES/DRESSINGS) ×1 IMPLANT
BIT DRILL 2.4X140 LONG SOLID (BIT) IMPLANT
BIT DRILL LONG 1.5 ZI (BIT) IMPLANT
BLADE SAW SGTL HD 18.5X60.5X1. (BLADE) ×1 IMPLANT
BLADE SAW SGTL NAR THIN XSHT (BLADE) IMPLANT
BLADE SURG 21 STRL SS (BLADE) ×1 IMPLANT
BNDG CMPR 5X4 CHSV STRCH STRL (GAUZE/BANDAGES/DRESSINGS) ×1
BNDG COHESIVE 4X5 TAN STRL (GAUZE/BANDAGES/DRESSINGS) IMPLANT
BNDG COHESIVE 4X5 TAN STRL LF (GAUZE/BANDAGES/DRESSINGS) IMPLANT
BNDG GAUZE DERMACEA FLUFF 4 (GAUZE/BANDAGES/DRESSINGS) IMPLANT
BNDG GZE DERMACEA 4 6PLY (GAUZE/BANDAGES/DRESSINGS) ×1
COVER SURGICAL LIGHT HANDLE (MISCELLANEOUS) ×1 IMPLANT
DRAPE INCISE IOBAN 66X45 STRL (DRAPES) ×1 IMPLANT
DRAPE U-SHAPE 47X51 STRL (DRAPES) ×1 IMPLANT
DRSG ADAPTIC 3X8 NADH LF (GAUZE/BANDAGES/DRESSINGS) IMPLANT
DURAPREP 26ML APPLICATOR (WOUND CARE) ×1 IMPLANT
ELECT REM PT RETURN 9FT ADLT (ELECTROSURGICAL) ×1
ELECTRODE REM PT RTRN 9FT ADLT (ELECTROSURGICAL) ×1 IMPLANT
GAUZE PAD ABD 8X10 STRL (GAUZE/BANDAGES/DRESSINGS) IMPLANT
GAUZE SPONGE 4X4 12PLY STRL (GAUZE/BANDAGES/DRESSINGS) IMPLANT
GLOVE BIOGEL PI IND STRL 9 (GLOVE) ×1 IMPLANT
GLOVE SURG ORTHO 9.0 STRL STRW (GLOVE) ×1 IMPLANT
GOWN STRL REUS W/ TWL XL LVL3 (GOWN DISPOSABLE) ×3 IMPLANT
GOWN STRL REUS W/TWL XL LVL3 (GOWN DISPOSABLE) ×3
K-WIRE ALPS MXV 1.6X6 ZI (WIRE) ×1
K-WIRE SMOOTH TROCAR 2.0X150 (WIRE) ×1
KIT BASIN OR (CUSTOM PROCEDURE TRAY) ×1 IMPLANT
KIT TURNOVER KIT B (KITS) ×1 IMPLANT
KWIRE ALPS MXV 1.6X6 ZI (WIRE) IMPLANT
KWIRE SMOOTH TROCAR 2.0X150 (WIRE) IMPLANT
NS IRRIG 1000ML POUR BTL (IV SOLUTION) ×1 IMPLANT
PACK ORTHO EXTREMITY (CUSTOM PROCEDURE TRAY) ×1 IMPLANT
PAD ARMBOARD 7.5X6 YLW CONV (MISCELLANEOUS) ×2 IMPLANT
PIN CAPS ORTHO GREEN .062 (PIN) IMPLANT
PLATE MTP GORILLA LONG 5D RT (Plate) IMPLANT
SCREW LOCK 3 3.5X18 (Screw) IMPLANT
SCREW LOCK PLATE R3 3.5X14 (Screw) IMPLANT
SCREW LOCK PLATE R3 3.5X16 (Screw) IMPLANT
SCREW NLOCK 2X12 (Screw) IMPLANT
SCREW NON LOCK 3.5X18 (Screw) IMPLANT
SCREW NON LOCKING 3.5X14 (Screw) IMPLANT
SPONGE T-LAP 18X18 ~~LOC~~+RFID (SPONGE) IMPLANT
SUT ETHILON 2 0 PSLX (SUTURE) ×2 IMPLANT
SUT VIC AB 2-0 CT1 27 (SUTURE) ×1
SUT VIC AB 2-0 CT1 TAPERPNT 27 (SUTURE) IMPLANT
TOWEL GREEN STERILE (TOWEL DISPOSABLE) ×1 IMPLANT
TOWEL GREEN STERILE FF (TOWEL DISPOSABLE) ×1 IMPLANT
TUBE CONNECTING 12X1/4 (SUCTIONS) ×1 IMPLANT
WATER STERILE IRR 1000ML POUR (IV SOLUTION) ×1 IMPLANT
YANKAUER SUCT BULB TIP NO VENT (SUCTIONS) ×1 IMPLANT

## 2022-11-19 NOTE — Telephone Encounter (Signed)
Duplicate message Will sign off on this message and respond to pt message from Triage.

## 2022-11-19 NOTE — Telephone Encounter (Signed)
I called pt and advised that he did not have to sleep in the boot that he could remove it at night. He advised that since he has been home and elevated his foot the bleeding has gotten better. I offered to have the pt come in today to change the dressing and he states that he would like to see how the night goes and I advised to call with any update in the morning. We can change it at any time. Pt voiced understanding and will cb tomorrow.

## 2022-11-19 NOTE — Transfer of Care (Signed)
Immediate Anesthesia Transfer of Care Note  Patient: [name] [name]  Procedure(s) Performed: RIGHT GREAT TOE METATARSALPHALANGEAL JOINT (MTPJ) FUSION, WEIL OSTEOTOMY 2ND METATARSAL AND PROXIMAL INTERPHALANGEAL JOINT RESECTION (Right: Toe)  Patient Location: PACU  Anesthesia Type:GA combined with regional for post-op pain  Level of Consciousness: drowsy and patient cooperative  Airway & Oxygen Therapy: Patient Spontanous Breathing  Post-op Assessment: Report given to RN and Post -op Vital signs reviewed and stable  Post vital signs: Reviewed and stable  Last Vitals:  Vitals Value Taken Time  BP 126/68 11/19/22 0935  Temp    Pulse 82 11/19/22 0941  Resp 15 11/19/22 0941  SpO2 95 % 11/19/22 0941  Vitals shown include unvalidated device data.  Last Pain:  Vitals:   11/19/22 0726  PainSc: 0-No pain         Complications: No notable events documented.

## 2022-11-19 NOTE — Anesthesia Postprocedure Evaluation (Signed)
Anesthesia Post Note  Patient: [name] [name]  Procedure(s) Performed: RIGHT GREAT TOE METATARSALPHALANGEAL JOINT (MTPJ) FUSION, WEIL OSTEOTOMY 2ND METATARSAL AND PROXIMAL INTERPHALANGEAL JOINT RESECTION (Right: Toe)     Patient location during evaluation: PACU Anesthesia Type: General and Regional Level of consciousness: awake and alert Pain management: pain level controlled Vital Signs Assessment: post-procedure vital signs reviewed and stable Respiratory status: spontaneous breathing, nonlabored ventilation, respiratory function stable and patient connected to nasal cannula oxygen Cardiovascular status: blood pressure returned to baseline and stable Postop Assessment: no apparent nausea or vomiting Anesthetic complications: no  No notable events documented.  Last Vitals:  Vitals:   11/19/22 1000 11/19/22 1015  BP: 132/78 (!) 140/80  Pulse: 76 78  Resp: 16 14  Temp:  36.4 C  SpO2: 96% 96%    Last Pain:  Vitals:   11/19/22 1015  PainSc: 0-No pain                 [name] L Cherith Tewell

## 2022-11-19 NOTE — Anesthesia Procedure Notes (Signed)
Anesthesia Regional Block: Adductor canal block   Pre-Anesthetic Checklist: , timeout performed,  Correct Patient, Correct Site, Correct Laterality,  Correct Procedure, Correct Position, site marked,  Risks and benefits discussed,  Pre-op evaluation,  At surgeon's request and post-op pain management  Laterality: Right  Prep: Maximum Sterile Barrier Precautions used, chloraprep       Needles:  Injection technique: Single-shot  Needle Type: Echogenic Stimulator Needle     Needle Length: 9cm  Needle Gauge: 21     Additional Needles:   Procedures:,,,, ultrasound used (permanent image in chart),,    Narrative:  Start time: 11/19/2022 7:54 AM End time: 11/19/2022 7:57 AM Injection made incrementally with aspirations every 5 mL. Anesthesiologist: Elmer Picker, MD

## 2022-11-19 NOTE — Anesthesia Procedure Notes (Signed)
Anesthesia Regional Block: Popliteal block   Pre-Anesthetic Checklist: , timeout performed,  Correct Patient, Correct Site, Correct Laterality,  Correct Procedure, Correct Position, site marked,  Risks and benefits discussed,  Pre-op evaluation,  At surgeon's request and post-op pain management  Laterality: Right  Prep: Maximum Sterile Barrier Precautions used, chloraprep       Needles:  Injection technique: Single-shot  Needle Type: Echogenic Stimulator Needle     Needle Length: 9cm  Needle Gauge: 21     Additional Needles:   Procedures:,,,, ultrasound used (permanent image in chart),,    Narrative:  Start time: 11/19/2022 7:50 AM End time: 11/19/2022 7:54 AM Injection made incrementally with aspirations every 5 mL. Anesthesiologist: Elmer Picker, MD

## 2022-11-19 NOTE — H&P (Signed)
[name] [name] is an 62 y.o. male.   Chief Complaint: Right forefoot pain and deformity HPI: Patient is a 62 year old gentleman with hallux rigidus and clawing of the second toe right foot. Patient has failed conservative therapy and would like to consider surgical intervention.   Past Medical History:  Diagnosis Date   Biceps tendon rupture    left   H/O seasonal allergies    Hypercholesterolemia    Nodular basal cell carcinoma (BCC) 05/04/2018   Right Lower Leg (MOH's)   OA (osteoarthritis)    BL hips   Prostate cancer (HCC) 02/13/2021   stage 3C   Sleep apnea    wears CPAP    Superficial basal cell carcinoma (BCC) 08/26/2011   Right Upper Back (never treated)   Wears contact lenses     Past Surgical History:  Procedure Laterality Date   APPENDECTOMY     COLONOSCOPY     DISTAL BICEPS TENDON REPAIR Left 09/15/2017   Procedure: LEFT DISTAL BICEPS TENDON REPAIR;  Surgeon: Cammy Copa, MD;  Location: [facility] OR;  Service: Orthopedics;  Laterality: Left;   FOOT SURGERY     left foot   JOINT REPLACEMENT     B/L hips   TONSILLECTOMY     TONSILLECTOMY AND ADENOIDECTOMY     WISDOM TOOTH EXTRACTION      Family History  Problem Relation Age of Onset   Alzheimer's disease Mother    Diabetes Father    Hypercholesterolemia Father    Social History:  reports that he has never smoked. He has never used smokeless tobacco. He reports current alcohol use. He reports that he does not use drugs.  Allergies:  Allergies  Allergen Reactions   Enoxaparin Anaphylaxis, Nausea Only and Other (See Comments)    Flu like symptoms - body pain, fatigue    Midazolam Anaphylaxis, Nausea Only and Other (See Comments)    Flu like symptoms - body pain, fatigue    Promethazine Anaphylaxis, Nausea Only and Other (See Comments)    Flu like symptoms - body pain, fatigue    No medications prior to admission.    No results found for this or any previous visit (from the past 48 hour(s)). No  results found.  Review of Systems  All other systems reviewed and are negative.   There were no vitals taken for this visit. Physical Exam  Patient is alert, oriented, no adenopathy, well-dressed, normal affect, normal respiratory effort. Examination patient is a strong dorsalis pedis pulse.  Radiographs show a intermetatarsal angle of 15 degrees and a hallux valgus angle of 50 degrees with cystic changes to the MTP joint with bony collapse and a noncongruent joint.  There is a long second metatarsal with fixed clawing of the PIP joint of the second toe.  Reviewed options including multiple osteotomies for bunion closure however with the arthritis of the MTP joint discussed that patient would most likely still be symptomatic. Assessment/Plan 1. Hallux rigidus, right foot   2. Acquired claw toe, right       Plan: Patient would like to proceed with a fusion of the great toe MTP joint Weil osteotomy of the second metatarsal and PIP resection of the second toe with pinning.  Risks and benefits were discussed including infection neurovascular injury problems healing of the bone and soft tissue need for additional surgery.  [name], MD 11/19/2022, 6:36 AM

## 2022-11-19 NOTE — Progress Notes (Signed)
Orthopedic Tech Progress Note Patient Details:  [name] [name] Nov 25, 1960 [id number]  Ortho Devices Type of Ortho Device: Crutches, CAM walker Ortho Device/Splint Location: Right foot Ortho Device/Splint Interventions: Application   Post Interventions Patient Tolerated: Well  [name] Samyria Rudie 11/19/2022, 10:38 AM

## 2022-11-19 NOTE — Telephone Encounter (Signed)
Patient called stating that he has blood around his bandage that's coming out and would like to know if he needs to change the dressing or just leave it.  Wanted to know if he should continue to wear the boot, sleep in it at night, or take it off.  Patient had RT GRT Toe and RT 2nd toe surgery today.  CB# [phone].  Please advise.  Thank you.

## 2022-11-19 NOTE — Interval H&P Note (Signed)
History and Physical Interval Note:  11/19/2022 7:01 AM  [name] [name]  has presented today for surgery, with the diagnosis of Osteoarthritis right Great Toe and Claw Right 2nd Toe.  The various methods of treatment have been discussed with the patient and family. After consideration of risks, benefits and other options for treatment, the patient has consented to  Procedure(s): RIGHT GREAT TOE METATARSALPHALANGEAL JOINT (MTPJ) FUSION, WEIL OSTEOTOMY 2ND METATARSAL AND PROXIMAL INTERPHALANGEAL JOINT RESECTION (Right) as a surgical intervention.  The patient's history has been reviewed, patient examined, no change in status, stable for surgery.  I have reviewed the patient's chart and labs.  Questions were answered to the patient's satisfaction.     [name]

## 2022-11-19 NOTE — Telephone Encounter (Signed)
Patient called. Would like to know if he is to sleep with boot on. Also transferred him to triage. Says he is having a lot of bleeding.

## 2022-11-19 NOTE — Op Note (Signed)
11/19/2022  9:47 AM  PATIENT:  [name] [name]    PRE-OPERATIVE DIAGNOSIS:  Osteoarthritis right Great Toe and Claw Right 2nd Toe  POST-OPERATIVE DIAGNOSIS:  Same  PROCEDURE:  RIGHT GREAT TOE METATARSALPHALANGEAL JOINT (MTPJ) FUSION, WEIL OSTEOTOMY 2ND METATARSAL AND PROXIMAL INTERPHALANGEAL JOINT RESECTION second toe.  C-arm fluoroscopy to verify reduction.  SURGEON:  [name], MD  PHYSICIAN ASSISTANT:None ANESTHESIA:   General  PREOPERATIVE INDICATIONS:  [name] [name] is a  62 y.o. male with a diagnosis of Osteoarthritis right Great Toe and Claw Right 2nd Toe who failed conservative measures and elected for surgical management.    The risks benefits and alternatives were discussed with the patient preoperatively including but not limited to the risks of infection, bleeding, nerve injury, cardiopulmonary complications, the need for revision surgery, among others, and the patient was willing to proceed.  OPERATIVE IMPLANTS:   Implant Name Type Inv. Item Serial No. Manufacturer Lot No. LRB No. Used Action  SCREW NON LOCK 3.5X18 - [id number] Screw SCREW NON LOCK 3.5X18  [employer]  Right 1 Implanted  SCREW NON LOCKING 3.5X14 - [id number] Screw SCREW NON LOCKING 3.5X14  [employer]  Right 1 Implanted  SCREW LOCK PLATE R3 2.8U13 - [id number] Screw SCREW LOCK PLATE R3 3.6U44  [employer]  Right 2 Implanted  SCREW LOCK PLATE R3 0.3K74 - [id number] Screw SCREW LOCK PLATE R3 4.3P29  [employer]  Right 2 Implanted  SCREW LOCK 3 3.5X18 - [id number] Screw SCREW LOCK 3 3.5X18  [employer]  Right 1 Implanted  LARGE 5 DEGREE MPJ PLATE Plate   [employer]  Right 1 Implanted  SCREW NLOCK 2X12 - [id number] Screw SCREW NLOCK 2X12  [employer]ORTH,TRAU,BIO,SG)  Right 1 Implanted    @ENCIMAGES @  OPERATIVE FINDINGS: C-arm possibly verified reduction of the line of the great toe and the clawing of the second toe.  OPERATIVE PROCEDURE: Patient brought the operating  room after undergoing a regional anesthetic he then underwent a general anesthesia.  After adequate levels anesthesia were obtained patient's right lower extremity was prepped using DuraPrep draped into a sterile field a timeout was called.  A medial longitudinal incision was made over the MTP joint this was carried down in subperiosteal dissection was used to identify the MTP joint.  There was degenerative arthritis of the MTP joint with dislocation.  A oscillating saw was used to resect the bony margins.  A K wire was inserted down the shaft of the first metatarsal and a cup reamer 21 mm was used to debride back to good bleeding subchondral bone.  Overhanging bone was resected with a saw.  K wire was then inserted into the proximal phalanx base and a cone reamer 21 mm was used to debride back to subchondral bone.  The wound was irrigated bony spurs were resected.  The joint was reduced and a large dorsal plate 5 degrees was placed dorsally this was secured proximally with a compression screw in the first metatarsal 3 locking screws were placed the joint was compressed and the compression screw was compressed.  Locking screws x 3 were then inserted proximally.  C-arm Shrosbree verified alignment.  The wound was irrigated normal saline the retinaculum was closed with 2-0 Vicryl the skin closed using 2-0 nylon.  A dorsal incision was made over the second toe MTP joint this was carried down to the joint extensor tendons were protected and a oscillating saw was used to perform  a Weil osteotomy the overhanging bone was resected the metatarsal head was translated proximally and a 2 mm x 12 mm screw was used to secure the Weil osteotomy.  The PIP joint was resected and a K wire was used to stabilize the second toe.  There was space between all toes.  The wound was irrigated with normal saline incision closed using 2-0 nylon a sterile dressing was applied patient was extubated taken the PACU in stable  condition.   DISCHARGE PLANNING:  Antibiotic duration: Preoperative antibiotics  Weightbearing: Nonweightbearing on the right  Pain medication: Prescription called in for Percocet  Dressing care/ Wound VAC: Dry dressing  Ambulatory devices: Crutches in a cam boot  Discharge to: Home.  Follow-up: In the office 1 week post operative.

## 2022-11-19 NOTE — Anesthesia Procedure Notes (Signed)
Procedure Name: LMA Insertion Date/Time: 11/19/2022 8:34 AM  Performed by: Marny Lowenstein, CRNAPre-anesthesia Checklist: Patient identified, Emergency Drugs available, Suction available and Patient being monitored Patient Re-evaluated:Patient Re-evaluated prior to induction Oxygen Delivery Method: Circle system utilized Preoxygenation: Pre-oxygenation with 100% oxygen Induction Type: IV induction Ventilation: Mask ventilation without difficulty LMA: LMA with gastric port inserted LMA Size: 4.0 Number of attempts: 1 Placement Confirmation: positive ETCO2 and breath sounds checked- equal and bilateral Dental Injury: Teeth and Oropharynx as per pre-operative assessment

## 2022-11-20 ENCOUNTER — Encounter: Payer: Self-pay | Admitting: Orthopedic Surgery

## 2022-11-20 ENCOUNTER — Ambulatory Visit (INDEPENDENT_AMBULATORY_CARE_PROVIDER_SITE_OTHER): Payer: BC Managed Care – PPO | Admitting: Orthopedic Surgery

## 2022-11-20 ENCOUNTER — Telehealth: Payer: Self-pay | Admitting: Orthopedic Surgery

## 2022-11-20 DIAGNOSIS — M2021 Hallux rigidus, right foot: Secondary | ICD-10-CM

## 2022-11-20 DIAGNOSIS — M205X1 Other deformities of toe(s) (acquired), right foot: Secondary | ICD-10-CM

## 2022-11-20 NOTE — Telephone Encounter (Signed)
Called pt and advised he can come in this morning. He is on his way.

## 2022-11-20 NOTE — Progress Notes (Signed)
Office Visit Note   Patient: [name] [name]           Date of Birth: [birth year]           MRN: [id number] Visit Date: 11/20/2022              Requested by: No referring provider defined for this encounter. PCP: Elias Else, MD (Inactive)  Chief Complaint  Patient presents with   Right Foot - Routine Post Op    11/19/22 right GT MTPJ fusion, weil osteotomy 2nd MT and proximal interphangeal joint resection      HPI: Patient is a 62 year old gentleman who is 1 day status post right great toe MTP fusion Weil osteotomy and PIP resection of the second toe.  Patient has had increased bleeding through the dressing.  Assessment & Plan: Visit Diagnoses:  1. Hallux rigidus, right foot   2. Acquired claw toe, right     Plan: Patient will start Dial soap cleansing dry dressing changes use his kneeling scooter elevation to minimize weightbearing.  Follow-Up Instructions: Return in about 1 week (around 11/27/2022).   Ortho Exam  Patient is alert, oriented, no adenopathy, well-dressed, normal affect, normal respiratory effort. Examination the incision is macerated the incisions are well-approximated.  New dry dressing was applied.  Imaging: No results found. No images are attached to the encounter.  Labs: No results found for: "HGBA1C", "ESRSEDRATE", "CRP", "LABURIC", "REPTSTATUS", "GRAMSTAIN", "CULT", "LABORGA"   No results found for: "ALBUMIN", "PREALBUMIN", "CBC"  No results found for: "MG" No results found for: "VD25OH"  No results found for: "PREALBUMIN"    Latest Ref Rng & Units 11/19/2022    6:59 AM 09/15/2017    6:45 AM 07/22/2007    4:45 AM  CBC EXTENDED  WBC 4.0 - 10.5 K/uL 3.8  4.8    RBC 4.22 - 5.81 MIL/uL 4.07  5.22    Hemoglobin 13.0 - 17.0 g/dL 40.9  81.1  8.6   HCT 91.4 - 52.0 % 35.6  45.6  24.4   Platelets 150 - 400 K/uL 204  195       There is no height or weight on file to calculate BMI.  Orders:  No orders of the defined types were placed in  this encounter.  No orders of the defined types were placed in this encounter.    Procedures: No procedures performed  Clinical Data: No additional findings.  ROS:  All other systems negative, except as noted in the HPI. Review of Systems  Objective: Vital Signs: There were no vitals taken for this visit.  Specialty Comments:  No specialty comments available.  PMFS History: Patient Active Problem List   Diagnosis Date Noted   Hallux rigidus, right foot 11/19/2022   Acquired claw toe, right 04/14/2016   Hallux valgus, acquired 04/14/2016   Generalized hyperhidrosis 09/20/2015   Insomnia 09/20/2015   Male erectile dysfunction 09/20/2015   Pain in joint 09/20/2015   Pure hypercholesterolemia 09/20/2015   Testicular hypofunction 09/20/2015   Allergic rhinitis 09/13/2014   Overweight (BMI 25.0-29.9) 09/13/2014   Sleep apnea, obstructive 09/13/2014   Past Medical History:  Diagnosis Date   Biceps tendon rupture    left   H/O seasonal allergies    Hypercholesterolemia    Nodular basal cell carcinoma (BCC) 05/04/2018   Right Lower Leg (MOH's)   OA (osteoarthritis)    BL hips   Prostate cancer (HCC) 02/13/2021   stage 3C   Sleep apnea    wears CPAP  Superficial basal cell carcinoma (BCC) 08/26/2011   Right Upper Back (never treated)   Wears contact lenses     Family History  Problem Relation Age of Onset   Alzheimer's disease Mother    Diabetes Father    Hypercholesterolemia Father     Past Surgical History:  Procedure Laterality Date   APPENDECTOMY     COLONOSCOPY     DISTAL BICEPS TENDON REPAIR Left 09/15/2017   Procedure: LEFT DISTAL BICEPS TENDON REPAIR;  Surgeon: Cammy Copa, MD;  Location: [facility] OR;  Service: Orthopedics;  Laterality: Left;   FOOT SURGERY     left foot   JOINT REPLACEMENT     B/L hips   TONSILLECTOMY     TONSILLECTOMY AND ADENOIDECTOMY     WISDOM TOOTH EXTRACTION     Social History   Occupational History   Not on file   Tobacco Use   Smoking status: Never   Smokeless tobacco: Never  Vaping Use   Vaping Use: Never used  Substance and Sexual Activity   Alcohol use: Yes    Comment: social   Drug use: Never   Sexual activity: Not on file

## 2022-11-20 NOTE — Telephone Encounter (Signed)
Patient called in stating his bandage is soaked and needs to be replaced and can come in today if possible

## 2022-11-22 ENCOUNTER — Encounter (HOSPITAL_COMMUNITY): Payer: Self-pay | Admitting: Orthopedic Surgery

## 2022-11-27 ENCOUNTER — Ambulatory Visit (INDEPENDENT_AMBULATORY_CARE_PROVIDER_SITE_OTHER): Payer: BC Managed Care – PPO | Admitting: Orthopedic Surgery

## 2022-11-27 ENCOUNTER — Other Ambulatory Visit (INDEPENDENT_AMBULATORY_CARE_PROVIDER_SITE_OTHER): Payer: BC Managed Care – PPO

## 2022-11-27 DIAGNOSIS — G8918 Other acute postprocedural pain: Secondary | ICD-10-CM | POA: Diagnosis not present

## 2022-11-27 DIAGNOSIS — M2021 Hallux rigidus, right foot: Secondary | ICD-10-CM

## 2022-11-27 MED ORDER — OXYCODONE-ACETAMINOPHEN 5-325 MG PO TABS: 1.0000 | ORAL_TABLET | ORAL | 0 refills | Status: DC | PRN

## 2022-11-28 DIAGNOSIS — Z461 Encounter for fitting and adjustment of hearing aid: Secondary | ICD-10-CM | POA: Diagnosis not present

## 2022-11-28 DIAGNOSIS — H906 Mixed conductive and sensorineural hearing loss, bilateral: Secondary | ICD-10-CM | POA: Diagnosis not present

## 2022-12-03 ENCOUNTER — Encounter: Payer: BC Managed Care – PPO | Admitting: Podiatry

## 2022-12-03 ENCOUNTER — Other Ambulatory Visit (HOSPITAL_BASED_OUTPATIENT_CLINIC_OR_DEPARTMENT_OTHER): Payer: Self-pay

## 2022-12-04 ENCOUNTER — Ambulatory Visit (INDEPENDENT_AMBULATORY_CARE_PROVIDER_SITE_OTHER): Payer: BC Managed Care – PPO | Admitting: Orthopedic Surgery

## 2022-12-04 ENCOUNTER — Encounter: Payer: Self-pay | Admitting: Orthopedic Surgery

## 2022-12-04 DIAGNOSIS — M205X1 Other deformities of toe(s) (acquired), right foot: Secondary | ICD-10-CM

## 2022-12-04 DIAGNOSIS — M2021 Hallux rigidus, right foot: Secondary | ICD-10-CM

## 2022-12-04 NOTE — Progress Notes (Signed)
Office Visit Note   Patient: [name] [name]           Date of Birth: Oct 17, 1960           MRN: [id number] Visit Date: 12/04/2022              Requested by: No referring provider defined for this encounter. PCP: Elias Else, MD (Inactive)  Chief Complaint  Patient presents with   Right Foot - Routine Post Op    11/19/22 right GT MTPJ fusion, weil osteotomy 2nd MT and proximal interphangeal joint resection      HPI: Patient is a 62 year old gentleman is seen in follow-up for great toe MTP fusion and Weil osteotomy of the second toe with PIP resection.  Assessment & Plan: Visit Diagnoses:  1. Hallux rigidus, right foot   2. Acquired claw toe, right     Plan: Sutures are harvested he will begin scar massage with Shea butter.  He will use the fracture boot and minimize weightbearing on his foot.  Patient was given a mouse pad to prevent floating of the second toe.  Anticipate advancing to a new balance sneaker with a carbon plate at follow-up.  Three-view radiographs of the right foot at follow-up.  Follow-Up Instructions: Return in about 3 weeks (around 12/25/2022).   Ortho Exam  Patient is alert, oriented, no adenopathy, well-dressed, normal affect, normal respiratory effort. Examination the incisions are well-healed sutures are harvested the pin was removed from the second toe.  Imaging: No results found. No images are attached to the encounter.  Labs: No results found for: "HGBA1C", "ESRSEDRATE", "CRP", "LABURIC", "REPTSTATUS", "GRAMSTAIN", "CULT", "LABORGA"   No results found for: "ALBUMIN", "PREALBUMIN", "CBC"  No results found for: "MG" No results found for: "VD25OH"  No results found for: "PREALBUMIN"    Latest Ref Rng & Units 11/19/2022    6:59 AM 09/15/2017    6:45 AM 07/22/2007    4:45 AM  CBC EXTENDED  WBC 4.0 - 10.5 K/uL 3.8  4.8    RBC 4.22 - 5.81 MIL/uL 4.07  5.22    Hemoglobin 13.0 - 17.0 g/dL 69.6  29.5  8.6   HCT 28.4 - 52.0 % 35.6  45.6   24.4   Platelets 150 - 400 K/uL 204  195       There is no height or weight on file to calculate BMI.  Orders:  No orders of the defined types were placed in this encounter.  No orders of the defined types were placed in this encounter.    Procedures: No procedures performed  Clinical Data: No additional findings.  ROS:  All other systems negative, except as noted in the HPI. Review of Systems  Objective: Vital Signs: There were no vitals taken for this visit.  Specialty Comments:  No specialty comments available.  PMFS History: Patient Active Problem List   Diagnosis Date Noted   Hallux rigidus, right foot 11/19/2022   Acquired claw toe, right 04/14/2016   Hallux valgus, acquired 04/14/2016   Generalized hyperhidrosis 09/20/2015   Insomnia 09/20/2015   Male erectile dysfunction 09/20/2015   Pain in joint 09/20/2015   Pure hypercholesterolemia 09/20/2015   Testicular hypofunction 09/20/2015   Allergic rhinitis 09/13/2014   Overweight (BMI 25.0-29.9) 09/13/2014   Sleep apnea, obstructive 09/13/2014   Past Medical History:  Diagnosis Date   Biceps tendon rupture    left   H/O seasonal allergies    Hypercholesterolemia    Nodular basal cell carcinoma (BCC)  05/04/2018   Right Lower Leg (MOH's)   OA (osteoarthritis)    BL hips   Prostate cancer (HCC) 02/13/2021   stage 3C   Sleep apnea    wears CPAP    Superficial basal cell carcinoma (BCC) 08/26/2011   Right Upper Back (never treated)   Wears contact lenses     Family History  Problem Relation Age of Onset   Alzheimer's disease Mother    Diabetes Father    Hypercholesterolemia Father     Past Surgical History:  Procedure Laterality Date   APPENDECTOMY     ARTHRODESIS METATARSALPHALANGEAL JOINT (MTPJ) Right 11/19/2022   Procedure: RIGHT GREAT TOE METATARSALPHALANGEAL JOINT (MTPJ) FUSION, WEIL OSTEOTOMY 2ND METATARSAL AND PROXIMAL INTERPHALANGEAL JOINT RESECTION;  Surgeon: [name], MD;   Location: [facility] OR;  Service: Orthopedics;  Laterality: Right;   COLONOSCOPY     DISTAL BICEPS TENDON REPAIR Left 09/15/2017   Procedure: LEFT DISTAL BICEPS TENDON REPAIR;  Surgeon: Cammy Copa, MD;  Location: [facility] OR;  Service: Orthopedics;  Laterality: Left;   FOOT SURGERY     left foot   JOINT REPLACEMENT     B/L hips   TONSILLECTOMY     TONSILLECTOMY AND ADENOIDECTOMY     WISDOM TOOTH EXTRACTION     Social History   Occupational History   Not on file  Tobacco Use   Smoking status: Never   Smokeless tobacco: Never  Vaping Use   Vaping Use: Never used  Substance and Sexual Activity   Alcohol use: Yes    Comment: social   Drug use: Never   Sexual activity: Not on file

## 2022-12-05 ENCOUNTER — Other Ambulatory Visit (HOSPITAL_BASED_OUTPATIENT_CLINIC_OR_DEPARTMENT_OTHER): Payer: Self-pay

## 2022-12-05 DIAGNOSIS — Z79631 Long term (current) use of antimetabolite agent: Secondary | ICD-10-CM | POA: Diagnosis not present

## 2022-12-05 DIAGNOSIS — Z79899 Other long term (current) drug therapy: Secondary | ICD-10-CM | POA: Diagnosis not present

## 2022-12-05 DIAGNOSIS — I7782 Antineutrophilic cytoplasmic antibody (ANCA) vasculitis: Secondary | ICD-10-CM | POA: Diagnosis not present

## 2022-12-05 DIAGNOSIS — Z7952 Long term (current) use of systemic steroids: Secondary | ICD-10-CM | POA: Diagnosis not present

## 2022-12-08 ENCOUNTER — Other Ambulatory Visit: Payer: Self-pay

## 2022-12-08 ENCOUNTER — Other Ambulatory Visit (HOSPITAL_BASED_OUTPATIENT_CLINIC_OR_DEPARTMENT_OTHER): Payer: Self-pay

## 2022-12-08 MED ORDER — ZEPBOUND 15 MG/0.5ML ~~LOC~~ SOAJ
15.0000 mg | SUBCUTANEOUS | 1 refills | Status: DC
  Filled 2022-12-08: qty 2, 28d supply, fill #0
  Filled 2023-01-01: qty 2, 28d supply, fill #1

## 2022-12-09 DIAGNOSIS — G4733 Obstructive sleep apnea (adult) (pediatric): Secondary | ICD-10-CM | POA: Diagnosis not present

## 2022-12-10 ENCOUNTER — Encounter: Payer: BC Managed Care – PPO | Admitting: Podiatry

## 2022-12-15 ENCOUNTER — Encounter: Payer: Self-pay | Admitting: Orthopedic Surgery

## 2022-12-15 NOTE — Progress Notes (Signed)
Office Visit Note   Patient: [name] [name]           Date of Birth: 07-19-1960           MRN: [id number] Visit Date: 11/27/2022              Requested by: No referring provider defined for this encounter. PCP: Elias Else, MD (Inactive)  Chief Complaint  Patient presents with   Other     11/19/22 right GT MTPJ fusion, weil osteotomy 2nd MT and proximal interphangeal joint resection          HPI: Patient is a 62 year old gentleman who is 1 week status post right great toe MTP fusion Weil osteotomy second metatarsal and PIP resection.  Assessment & Plan: Visit Diagnoses:  1. Post-op pain   2. Hallux rigidus, right foot     Plan: Will follow-up in 1 week to remove the sutures.  Prescription provided for Percocet.  Follow-Up Instructions: Return in about 1 week (around 12/04/2022).   Ortho Exam  Patient is alert, oriented, no adenopathy, well-dressed, normal affect, normal respiratory effort. Examination the incisions are well-approximated there is no cellulitis no drainage.  Imaging: No results found. No images are attached to the encounter.  Labs: No results found for: "HGBA1C", "ESRSEDRATE", "CRP", "LABURIC", "REPTSTATUS", "GRAMSTAIN", "CULT", "LABORGA"   No results found for: "ALBUMIN", "PREALBUMIN", "CBC"  No results found for: "MG" No results found for: "VD25OH"  No results found for: "PREALBUMIN"    Latest Ref Rng & Units 11/19/2022    6:59 AM 09/15/2017    6:45 AM 07/22/2007    4:45 AM  CBC EXTENDED  WBC 4.0 - 10.5 K/uL 3.8  4.8    RBC 4.22 - 5.81 MIL/uL 4.07  5.22    Hemoglobin 13.0 - 17.0 g/dL 78.2  95.6  8.6   HCT 21.3 - 52.0 % 35.6  45.6  24.4   Platelets 150 - 400 K/uL 204  195       There is no height or weight on file to calculate BMI.  Orders:  Orders Placed This Encounter  Procedures   XR Foot Complete Right   Meds ordered this encounter  Medications   oxyCODONE-acetaminophen (PERCOCET/ROXICET) 5-325 MG tablet    Sig: Take  1 tablet by mouth every 4 (four) hours as needed.    Dispense:  30 tablet    Refill:  0     Procedures: No procedures performed  Clinical Data: No additional findings.  ROS:  All other systems negative, except as noted in the HPI. Review of Systems  Objective: Vital Signs: There were no vitals taken for this visit.  Specialty Comments:  No specialty comments available.  PMFS History: Patient Active Problem List   Diagnosis Date Noted   Hallux rigidus, right foot 11/19/2022   Acquired claw toe, right 04/14/2016   Hallux valgus, acquired 04/14/2016   Generalized hyperhidrosis 09/20/2015   Insomnia 09/20/2015   Male erectile dysfunction 09/20/2015   Pain in joint 09/20/2015   Pure hypercholesterolemia 09/20/2015   Testicular hypofunction 09/20/2015   Allergic rhinitis 09/13/2014   Overweight (BMI 25.0-29.9) 09/13/2014   Sleep apnea, obstructive 09/13/2014   Past Medical History:  Diagnosis Date   Biceps tendon rupture    left   H/O seasonal allergies    Hypercholesterolemia    Nodular basal cell carcinoma (BCC) 05/04/2018   Right Lower Leg (MOH's)   OA (osteoarthritis)    BL hips   Prostate cancer (HCC) 02/13/2021  stage 3C   Sleep apnea    wears CPAP    Superficial basal cell carcinoma (BCC) 08/26/2011   Right Upper Back (never treated)   Wears contact lenses     Family History  Problem Relation Age of Onset   Alzheimer's disease Mother    Diabetes Father    Hypercholesterolemia Father     Past Surgical History:  Procedure Laterality Date   APPENDECTOMY     ARTHRODESIS METATARSALPHALANGEAL JOINT (MTPJ) Right 11/19/2022   Procedure: RIGHT GREAT TOE METATARSALPHALANGEAL JOINT (MTPJ) FUSION, WEIL OSTEOTOMY 2ND METATARSAL AND PROXIMAL INTERPHALANGEAL JOINT RESECTION;  Surgeon: [name], MD;  Location: [facility] OR;  Service: Orthopedics;  Laterality: Right;   COLONOSCOPY     DISTAL BICEPS TENDON REPAIR Left 09/15/2017   Procedure: LEFT DISTAL BICEPS TENDON  REPAIR;  Surgeon: Cammy Copa, MD;  Location: [facility] OR;  Service: Orthopedics;  Laterality: Left;   FOOT SURGERY     left foot   JOINT REPLACEMENT     B/L hips   TONSILLECTOMY     TONSILLECTOMY AND ADENOIDECTOMY     WISDOM TOOTH EXTRACTION     Social History   Occupational History   Not on file  Tobacco Use   Smoking status: Never   Smokeless tobacco: Never  Vaping Use   Vaping Use: Never used  Substance and Sexual Activity   Alcohol use: Yes    Comment: social   Drug use: Never   Sexual activity: Not on file

## 2022-12-24 ENCOUNTER — Encounter: Payer: BC Managed Care – PPO | Admitting: Podiatry

## 2022-12-24 DIAGNOSIS — R7401 Elevation of levels of liver transaminase levels: Secondary | ICD-10-CM | POA: Diagnosis not present

## 2022-12-24 DIAGNOSIS — Z13220 Encounter for screening for lipoid disorders: Secondary | ICD-10-CM | POA: Diagnosis not present

## 2022-12-24 DIAGNOSIS — I7782 Antineutrophilic cytoplasmic antibody (ANCA) vasculitis: Secondary | ICD-10-CM | POA: Diagnosis not present

## 2022-12-24 DIAGNOSIS — D649 Anemia, unspecified: Secondary | ICD-10-CM | POA: Diagnosis not present

## 2022-12-24 DIAGNOSIS — E539 Vitamin B deficiency, unspecified: Secondary | ICD-10-CM | POA: Diagnosis not present

## 2022-12-24 DIAGNOSIS — R739 Hyperglycemia, unspecified: Secondary | ICD-10-CM | POA: Diagnosis not present

## 2022-12-25 ENCOUNTER — Ambulatory Visit: Payer: BC Managed Care – PPO | Admitting: Orthopedic Surgery

## 2022-12-25 ENCOUNTER — Other Ambulatory Visit (INDEPENDENT_AMBULATORY_CARE_PROVIDER_SITE_OTHER): Payer: BC Managed Care – PPO

## 2022-12-25 ENCOUNTER — Encounter: Payer: Self-pay | Admitting: Orthopedic Surgery

## 2022-12-25 DIAGNOSIS — M79671 Pain in right foot: Secondary | ICD-10-CM

## 2022-12-25 NOTE — Progress Notes (Signed)
Office Visit Note   Patient: [name] [name]           Date of Birth: 08/07/1960           MRN: [id number] Visit Date: 12/25/2022              Requested by: No referring provider defined for this encounter. PCP: Elias Else, MD (Inactive)  Chief Complaint  Patient presents with   Right Foot - Routine Post Op    11/19/2022 right GT MTP joint fusion Weil osteotomy 2nd MT and PIP joint resection       HPI: Patient is a 62 year old gentleman who is 5 weeks status post right great toe MTP fusion and Weil osteotomy of the second metatarsal.  Patient has a carbon plate in his sneaker is ambulating with regular shoewear without problems.  Assessment & Plan: Visit Diagnoses:  1. Pain in right foot     Plan: Continue to increase his activities as tolerated recommended placing the orthotic on top of the carbon plate.  Follow-Up Instructions: Return if symptoms worsen or fail to improve.   Ortho Exam  Patient is alert, oriented, no adenopathy, well-dressed, normal affect, normal respiratory effort. Examination the incisions are well-healed there is still some swelling.  There is no overlapping of the toes there is a good space in the first webspace without pressure on the first toe or second toe  Imaging: XR Foot Complete Right  Result Date: 12/25/2022 Three-view radiographs of the right foot shows a stable healed MTP fusion of the great toe and a stable Weil osteotomy of the second metatarsal.  No images are attached to the encounter.  Labs: No results found for: "HGBA1C", "ESRSEDRATE", "CRP", "LABURIC", "REPTSTATUS", "GRAMSTAIN", "CULT", "LABORGA"   No results found for: "ALBUMIN", "PREALBUMIN", "CBC"  No results found for: "MG" No results found for: "VD25OH"  No results found for: "PREALBUMIN"    Latest Ref Rng & Units 11/19/2022    6:59 AM 09/15/2017    6:45 AM 07/22/2007    4:45 AM  CBC EXTENDED  WBC 4.0 - 10.5 K/uL 3.8  4.8    RBC 4.22 - 5.81 MIL/uL 4.07  5.22     Hemoglobin 13.0 - 17.0 g/dL 40.9  81.1  8.6   HCT 91.4 - 52.0 % 35.6  45.6  24.4   Platelets 150 - 400 K/uL 204  195       There is no height or weight on file to calculate BMI.  Orders:  Orders Placed This Encounter  Procedures   XR Foot Complete Right   No orders of the defined types were placed in this encounter.    Procedures: No procedures performed  Clinical Data: No additional findings.  ROS:  All other systems negative, except as noted in the HPI. Review of Systems  Objective: Vital Signs: There were no vitals taken for this visit.  Specialty Comments:  No specialty comments available.  PMFS History: Patient Active Problem List   Diagnosis Date Noted   Hallux rigidus, right foot 11/19/2022   Acquired claw toe, right 04/14/2016   Hallux valgus, acquired 04/14/2016   Generalized hyperhidrosis 09/20/2015   Insomnia 09/20/2015   Male erectile dysfunction 09/20/2015   Pain in joint 09/20/2015   Pure hypercholesterolemia 09/20/2015   Testicular hypofunction 09/20/2015   Allergic rhinitis 09/13/2014   Overweight (BMI 25.0-29.9) 09/13/2014   Sleep apnea, obstructive 09/13/2014   Past Medical History:  Diagnosis Date   Biceps tendon rupture  left   H/O seasonal allergies    Hypercholesterolemia    Nodular basal cell carcinoma (BCC) 05/04/2018   Right Lower Leg (MOH's)   OA (osteoarthritis)    BL hips   Prostate cancer (HCC) 02/13/2021   stage 3C   Sleep apnea    wears CPAP    Superficial basal cell carcinoma (BCC) 08/26/2011   Right Upper Back (never treated)   Wears contact lenses     Family History  Problem Relation Age of Onset   Alzheimer's disease Mother    Diabetes Father    Hypercholesterolemia Father     Past Surgical History:  Procedure Laterality Date   APPENDECTOMY     ARTHRODESIS METATARSALPHALANGEAL JOINT (MTPJ) Right 11/19/2022   Procedure: RIGHT GREAT TOE METATARSALPHALANGEAL JOINT (MTPJ) FUSION, WEIL OSTEOTOMY 2ND  METATARSAL AND PROXIMAL INTERPHALANGEAL JOINT RESECTION;  Surgeon: [name], MD;  Location: [facility] OR;  Service: Orthopedics;  Laterality: Right;   COLONOSCOPY     DISTAL BICEPS TENDON REPAIR Left 09/15/2017   Procedure: LEFT DISTAL BICEPS TENDON REPAIR;  Surgeon: Cammy Copa, MD;  Location: [facility] OR;  Service: Orthopedics;  Laterality: Left;   FOOT SURGERY     left foot   JOINT REPLACEMENT     B/L hips   TONSILLECTOMY     TONSILLECTOMY AND ADENOIDECTOMY     WISDOM TOOTH EXTRACTION     Social History   Occupational History   Not on file  Tobacco Use   Smoking status: Never   Smokeless tobacco: Never  Vaping Use   Vaping status: Never Used  Substance and Sexual Activity   Alcohol use: Yes    Comment: social   Drug use: Never   Sexual activity: Not on file

## 2022-12-30 DIAGNOSIS — G4733 Obstructive sleep apnea (adult) (pediatric): Secondary | ICD-10-CM | POA: Diagnosis not present

## 2023-01-01 ENCOUNTER — Other Ambulatory Visit (HOSPITAL_BASED_OUTPATIENT_CLINIC_OR_DEPARTMENT_OTHER): Payer: Self-pay

## 2023-01-02 DIAGNOSIS — C61 Malignant neoplasm of prostate: Secondary | ICD-10-CM | POA: Diagnosis not present

## 2023-01-02 DIAGNOSIS — L820 Inflamed seborrheic keratosis: Secondary | ICD-10-CM | POA: Diagnosis not present

## 2023-01-08 DIAGNOSIS — C61 Malignant neoplasm of prostate: Secondary | ICD-10-CM | POA: Diagnosis not present

## 2023-01-09 DIAGNOSIS — G4733 Obstructive sleep apnea (adult) (pediatric): Secondary | ICD-10-CM | POA: Diagnosis not present

## 2023-01-15 ENCOUNTER — Encounter: Payer: Self-pay | Admitting: Orthopedic Surgery

## 2023-01-17 IMAGING — CT NM PET TUM IMG SKULL BASE T - THIGH
1 of 7 series · 1 of 25 positions shown · non-contrast
Comparison: None.

CLINICAL DATA: A male at age 60 presenting for recently diagnosed
prostate cancer with PSA of 18.

EXAM:
NUCLEAR MEDICINE PET SKULL BASE TO THIGH
TECHNIQUE: 9.32 mCi F18 Piflufolastat (Pylarify) was injected intravenously.
Full-ring PET imaging was performed from the skull base to thigh
after the radiotracer. CT data was obtained and used for attenuation
correction and anatomic localization.

[Series 4: ct sk_thigh 5.0 bf37 · axial · 5.0mm · 0.98mm/px · 1 of 265 slices shown]
[im 199/265  brain]
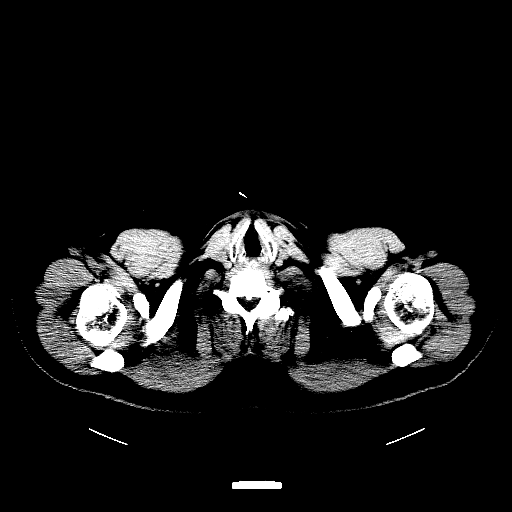

[1 of 25 positions shown; findings below may reference images not displayed]

FINDINGS: NECK

No radiotracer activity in neck lymph nodes.

Incidental CT finding: None

CHEST

No radiotracer accumulation within mediastinal or hilar lymph nodes.
No suspicious pulmonary nodules on the CT scan.

Incidental CT finding: Aortic atherosclerosis with calcified
atherosclerotic changes, mild and without dilation. Normal caliber
central pulmonary vasculature. Normal heart size without pericardial
effusion. Three-vessel coronary artery disease. No adenopathy by
size criteria in the chest.

No consolidation or pleural effusion with mild basilar atelectasis.
The airways are patent.

ABDOMEN/PELVIS

Prostate: Focal activity in the LEFT prostate posterolateral aspect
of the prostate with a maximum SUV of 13.8.

Lymph nodes: No abnormal radiotracer accumulation within pelvic or
abdominal nodes.

Liver: No evidence of liver metastasis

Incidental CT finding: No acute findings relative to liver,
gallbladder, pancreas, spleen, adrenal glands or kidneys. Streak
artifact from bilateral hip arthroplasties obscures the urinary
bladder. No acute gastrointestinal findings. Colonic diverticulosis.
Aortic atherosclerotic changes of the abdominal aorta without
aneurysmal dilation. No adenopathy in the abdomen or pelvis by size
criteria. Small ventral hernia just above the umbilicus contains
only fat.

SKELETON

No focal  activity to suggest skeletal metastasis.
IMPRESSION: Marked radiotracer accumulation in the LEFT hemi-prostate in this
patient with known prostate cancer. No signs of metastatic disease.

Aortic atherosclerosis.

Aortic Atherosclerosis ([id number]-GAB.B).

## 2023-02-04 DIAGNOSIS — C61 Malignant neoplasm of prostate: Secondary | ICD-10-CM | POA: Diagnosis not present

## 2023-02-04 DIAGNOSIS — R7303 Prediabetes: Secondary | ICD-10-CM | POA: Diagnosis not present

## 2023-02-04 DIAGNOSIS — M2550 Pain in unspecified joint: Secondary | ICD-10-CM | POA: Diagnosis not present

## 2023-02-04 DIAGNOSIS — E7800 Pure hypercholesterolemia, unspecified: Secondary | ICD-10-CM | POA: Diagnosis not present

## 2023-02-04 DIAGNOSIS — Z0000 Encounter for general adult medical examination without abnormal findings: Secondary | ICD-10-CM | POA: Diagnosis not present

## 2023-02-04 DIAGNOSIS — Z8546 Personal history of malignant neoplasm of prostate: Secondary | ICD-10-CM | POA: Diagnosis not present

## 2023-02-05 ENCOUNTER — Other Ambulatory Visit (HOSPITAL_BASED_OUTPATIENT_CLINIC_OR_DEPARTMENT_OTHER): Payer: Self-pay

## 2023-02-05 DIAGNOSIS — E782 Mixed hyperlipidemia: Secondary | ICD-10-CM | POA: Diagnosis not present

## 2023-02-05 DIAGNOSIS — G4730 Sleep apnea, unspecified: Secondary | ICD-10-CM | POA: Diagnosis not present

## 2023-02-05 DIAGNOSIS — Z6828 Body mass index (BMI) 28.0-28.9, adult: Secondary | ICD-10-CM | POA: Diagnosis not present

## 2023-02-05 MED ORDER — ZEPBOUND 15 MG/0.5ML ~~LOC~~ SOAJ
15.0000 mg | SUBCUTANEOUS | 2 refills | Status: DC
  Filled 2023-02-05: qty 2, 28d supply, fill #0
  Filled 2023-03-25: qty 2, 28d supply, fill #1
  Filled 2023-04-18: qty 2, 28d supply, fill #2

## 2023-02-09 DIAGNOSIS — G4733 Obstructive sleep apnea (adult) (pediatric): Secondary | ICD-10-CM | POA: Diagnosis not present

## 2023-02-19 DIAGNOSIS — D649 Anemia, unspecified: Secondary | ICD-10-CM | POA: Diagnosis not present

## 2023-02-19 DIAGNOSIS — R748 Abnormal levels of other serum enzymes: Secondary | ICD-10-CM | POA: Diagnosis not present

## 2023-02-20 ENCOUNTER — Other Ambulatory Visit: Payer: Self-pay | Admitting: Family Medicine

## 2023-02-20 DIAGNOSIS — R748 Abnormal levels of other serum enzymes: Secondary | ICD-10-CM

## 2023-02-26 DIAGNOSIS — I7782 Antineutrophilic cytoplasmic antibody (ANCA) vasculitis: Secondary | ICD-10-CM | POA: Diagnosis not present

## 2023-02-26 DIAGNOSIS — R918 Other nonspecific abnormal finding of lung field: Secondary | ICD-10-CM | POA: Diagnosis not present

## 2023-03-03 DIAGNOSIS — A09 Infectious gastroenteritis and colitis, unspecified: Secondary | ICD-10-CM | POA: Diagnosis not present

## 2023-03-11 DIAGNOSIS — G4733 Obstructive sleep apnea (adult) (pediatric): Secondary | ICD-10-CM | POA: Diagnosis not present

## 2023-03-12 ENCOUNTER — Other Ambulatory Visit: Payer: BC Managed Care – PPO

## 2023-03-16 DIAGNOSIS — E7800 Pure hypercholesterolemia, unspecified: Secondary | ICD-10-CM | POA: Diagnosis not present

## 2023-03-16 DIAGNOSIS — N952 Postmenopausal atrophic vaginitis: Secondary | ICD-10-CM | POA: Diagnosis not present

## 2023-03-16 DIAGNOSIS — R7303 Prediabetes: Secondary | ICD-10-CM | POA: Diagnosis not present

## 2023-03-16 DIAGNOSIS — M6289 Other specified disorders of muscle: Secondary | ICD-10-CM | POA: Diagnosis not present

## 2023-03-16 DIAGNOSIS — N958 Other specified menopausal and perimenopausal disorders: Secondary | ICD-10-CM | POA: Diagnosis not present

## 2023-03-16 DIAGNOSIS — E039 Hypothyroidism, unspecified: Secondary | ICD-10-CM | POA: Diagnosis not present

## 2023-03-16 DIAGNOSIS — N9489 Other specified conditions associated with female genital organs and menstrual cycle: Secondary | ICD-10-CM | POA: Diagnosis not present

## 2023-03-16 DIAGNOSIS — I7782 Antineutrophilic cytoplasmic antibody (ANCA) vasculitis: Secondary | ICD-10-CM | POA: Diagnosis not present

## 2023-03-16 DIAGNOSIS — Z79899 Other long term (current) drug therapy: Secondary | ICD-10-CM | POA: Diagnosis not present

## 2023-03-20 DIAGNOSIS — Z79899 Other long term (current) drug therapy: Secondary | ICD-10-CM | POA: Diagnosis not present

## 2023-03-20 DIAGNOSIS — I7782 Antineutrophilic cytoplasmic antibody (ANCA) vasculitis: Secondary | ICD-10-CM | POA: Diagnosis not present

## 2023-03-25 ENCOUNTER — Other Ambulatory Visit (HOSPITAL_BASED_OUTPATIENT_CLINIC_OR_DEPARTMENT_OTHER): Payer: Self-pay

## 2023-03-25 ENCOUNTER — Other Ambulatory Visit: Payer: Self-pay

## 2023-03-26 DIAGNOSIS — M79671 Pain in right foot: Secondary | ICD-10-CM | POA: Diagnosis not present

## 2023-04-03 DIAGNOSIS — M79671 Pain in right foot: Secondary | ICD-10-CM | POA: Diagnosis not present

## 2023-04-07 DIAGNOSIS — M79671 Pain in right foot: Secondary | ICD-10-CM | POA: Diagnosis not present

## 2023-04-09 DIAGNOSIS — M79671 Pain in right foot: Secondary | ICD-10-CM | POA: Diagnosis not present

## 2023-04-11 DIAGNOSIS — G4733 Obstructive sleep apnea (adult) (pediatric): Secondary | ICD-10-CM | POA: Diagnosis not present

## 2023-04-14 DIAGNOSIS — M79671 Pain in right foot: Secondary | ICD-10-CM | POA: Diagnosis not present

## 2023-04-16 DIAGNOSIS — M79671 Pain in right foot: Secondary | ICD-10-CM | POA: Diagnosis not present

## 2023-04-18 ENCOUNTER — Other Ambulatory Visit (HOSPITAL_BASED_OUTPATIENT_CLINIC_OR_DEPARTMENT_OTHER): Payer: Self-pay

## 2023-04-23 DIAGNOSIS — M79671 Pain in right foot: Secondary | ICD-10-CM | POA: Diagnosis not present

## 2023-04-24 DIAGNOSIS — I7782 Antineutrophilic cytoplasmic antibody (ANCA) vasculitis: Secondary | ICD-10-CM | POA: Diagnosis not present

## 2023-04-24 DIAGNOSIS — R739 Hyperglycemia, unspecified: Secondary | ICD-10-CM | POA: Diagnosis not present

## 2023-05-04 LAB — COLOGUARD: COLOGUARD: NEGATIVE

## 2023-05-04 LAB — EXTERNAL GENERIC LAB PROCEDURE: COLOGUARD: NEGATIVE

## 2023-05-11 DIAGNOSIS — G4733 Obstructive sleep apnea (adult) (pediatric): Secondary | ICD-10-CM | POA: Diagnosis not present

## 2023-05-22 ENCOUNTER — Other Ambulatory Visit (HOSPITAL_BASED_OUTPATIENT_CLINIC_OR_DEPARTMENT_OTHER): Payer: Self-pay

## 2023-05-22 DIAGNOSIS — M79671 Pain in right foot: Secondary | ICD-10-CM | POA: Diagnosis not present

## 2023-05-27 ENCOUNTER — Other Ambulatory Visit (HOSPITAL_BASED_OUTPATIENT_CLINIC_OR_DEPARTMENT_OTHER): Payer: Self-pay

## 2023-05-27 MED ORDER — ZEPBOUND 15 MG/0.5ML ~~LOC~~ SOAJ
15.0000 mg | SUBCUTANEOUS | 2 refills | Status: AC
  Filled 2023-05-27: qty 2, 28d supply, fill #0
  Filled 2023-06-21: qty 2, 28d supply, fill #1
  Filled 2023-07-19: qty 2, 28d supply, fill #2

## 2023-05-28 DIAGNOSIS — Z6830 Body mass index (BMI) 30.0-30.9, adult: Secondary | ICD-10-CM | POA: Diagnosis not present

## 2023-05-28 DIAGNOSIS — M79671 Pain in right foot: Secondary | ICD-10-CM | POA: Diagnosis not present

## 2023-05-28 DIAGNOSIS — E782 Mixed hyperlipidemia: Secondary | ICD-10-CM | POA: Diagnosis not present

## 2023-05-29 DIAGNOSIS — Z79899 Other long term (current) drug therapy: Secondary | ICD-10-CM | POA: Diagnosis not present

## 2023-05-29 DIAGNOSIS — I7782 Antineutrophilic cytoplasmic antibody (ANCA) vasculitis: Secondary | ICD-10-CM | POA: Diagnosis not present

## 2023-06-08 DIAGNOSIS — M79671 Pain in right foot: Secondary | ICD-10-CM | POA: Diagnosis not present

## 2023-06-09 DIAGNOSIS — R0781 Pleurodynia: Secondary | ICD-10-CM | POA: Diagnosis not present

## 2023-06-22 ENCOUNTER — Other Ambulatory Visit (HOSPITAL_BASED_OUTPATIENT_CLINIC_OR_DEPARTMENT_OTHER): Payer: Self-pay

## 2023-06-23 ENCOUNTER — Encounter (HOSPITAL_BASED_OUTPATIENT_CLINIC_OR_DEPARTMENT_OTHER): Payer: Self-pay

## 2023-06-23 ENCOUNTER — Other Ambulatory Visit (HOSPITAL_BASED_OUTPATIENT_CLINIC_OR_DEPARTMENT_OTHER): Payer: Self-pay

## 2023-06-23 MED ORDER — ZEPBOUND 15 MG/0.5ML ~~LOC~~ SOAJ
15.0000 mg | SUBCUTANEOUS | 0 refills | Status: DC
  Filled 2023-06-23 – 2023-09-10 (×2): qty 2, 28d supply, fill #0

## 2023-07-20 ENCOUNTER — Other Ambulatory Visit (HOSPITAL_BASED_OUTPATIENT_CLINIC_OR_DEPARTMENT_OTHER): Payer: Self-pay

## 2023-08-04 ENCOUNTER — Other Ambulatory Visit (HOSPITAL_BASED_OUTPATIENT_CLINIC_OR_DEPARTMENT_OTHER): Payer: Self-pay

## 2023-08-04 MED ORDER — ZEPBOUND 15 MG/0.5ML ~~LOC~~ SOAJ
15.0000 mg | SUBCUTANEOUS | 0 refills | Status: AC
  Filled 2023-08-04 – 2023-08-10 (×2): qty 2, 28d supply, fill #0

## 2023-08-10 ENCOUNTER — Other Ambulatory Visit (HOSPITAL_BASED_OUTPATIENT_CLINIC_OR_DEPARTMENT_OTHER): Payer: Self-pay

## 2023-09-10 ENCOUNTER — Other Ambulatory Visit (HOSPITAL_BASED_OUTPATIENT_CLINIC_OR_DEPARTMENT_OTHER): Payer: Self-pay

## 2023-10-07 ENCOUNTER — Other Ambulatory Visit (HOSPITAL_BASED_OUTPATIENT_CLINIC_OR_DEPARTMENT_OTHER): Payer: Self-pay

## 2023-10-07 MED ORDER — ZEPBOUND 15 MG/0.5ML ~~LOC~~ SOAJ
15.0000 mg | SUBCUTANEOUS | 1 refills | Status: AC
  Filled 2023-10-07: qty 2, 28d supply, fill #0
  Filled 2023-11-02: qty 2, 28d supply, fill #1

## 2023-11-02 ENCOUNTER — Other Ambulatory Visit (HOSPITAL_BASED_OUTPATIENT_CLINIC_OR_DEPARTMENT_OTHER): Payer: Self-pay

## 2023-11-03 ENCOUNTER — Other Ambulatory Visit (HOSPITAL_BASED_OUTPATIENT_CLINIC_OR_DEPARTMENT_OTHER): Payer: Self-pay

## 2023-11-03 MED ORDER — ZEPBOUND 15 MG/0.5ML ~~LOC~~ SOAJ
15.0000 mg | SUBCUTANEOUS | 0 refills | Status: DC
  Filled 2023-11-03 – 2023-12-05 (×2): qty 2, 28d supply, fill #0

## 2023-12-05 ENCOUNTER — Other Ambulatory Visit (HOSPITAL_BASED_OUTPATIENT_CLINIC_OR_DEPARTMENT_OTHER): Payer: Self-pay

## 2023-12-17 ENCOUNTER — Other Ambulatory Visit (HOSPITAL_BASED_OUTPATIENT_CLINIC_OR_DEPARTMENT_OTHER): Payer: Self-pay

## 2023-12-17 MED ORDER — ZEPBOUND 15 MG/0.5ML ~~LOC~~ SOAJ
15.0000 mg | SUBCUTANEOUS | 0 refills | Status: AC
  Filled 2023-12-30: qty 2, 28d supply, fill #0

## 2023-12-17 MED ORDER — ZEPBOUND 15 MG/0.5ML ~~LOC~~ SOAJ
15.0000 mg | SUBCUTANEOUS | 0 refills | Status: DC
  Filled 2023-12-17 – 2024-01-27 (×2): qty 2, 28d supply, fill #0

## 2023-12-18 ENCOUNTER — Other Ambulatory Visit (HOSPITAL_BASED_OUTPATIENT_CLINIC_OR_DEPARTMENT_OTHER): Payer: Self-pay

## 2023-12-30 ENCOUNTER — Other Ambulatory Visit (HOSPITAL_BASED_OUTPATIENT_CLINIC_OR_DEPARTMENT_OTHER): Payer: Self-pay

## 2024-01-27 ENCOUNTER — Other Ambulatory Visit (HOSPITAL_BASED_OUTPATIENT_CLINIC_OR_DEPARTMENT_OTHER): Payer: Self-pay

## 2024-01-29 ENCOUNTER — Other Ambulatory Visit (HOSPITAL_BASED_OUTPATIENT_CLINIC_OR_DEPARTMENT_OTHER): Payer: Self-pay

## 2024-01-29 MED ORDER — ZEPBOUND 15 MG/0.5ML ~~LOC~~ SOAJ
15.0000 mg | SUBCUTANEOUS | 0 refills | Status: DC
  Filled 2024-01-29 – 2024-02-24 (×2): qty 2, 28d supply, fill #0

## 2024-02-12 ENCOUNTER — Ambulatory Visit: Admitting: Podiatry

## 2024-02-22 ENCOUNTER — Ambulatory Visit (INDEPENDENT_AMBULATORY_CARE_PROVIDER_SITE_OTHER): Payer: Self-pay | Admitting: Podiatry

## 2024-02-22 DIAGNOSIS — M2011 Hallux valgus (acquired), right foot: Secondary | ICD-10-CM | POA: Diagnosis not present

## 2024-02-22 NOTE — Progress Notes (Signed)
 Chief Complaint  Patient presents with   Callouses    Right foot. Lateral side near 5th toe. Dr. Carolan did hammer toe correction 06/204 rt 1st and 2nd. Not diabetic. Takes asa 81 mg.     Subjective: 63 y.o. male presenting to the office today for evaluation of a symptomatic callus to the plantar aspect of the fifth MTP right foot.  History of bunion and hammertoe repair to the right foot with Dr. Harden.   Past Medical History:  Diagnosis Date   Biceps tendon rupture    left   H/O seasonal allergies    Hypercholesterolemia    Nodular basal cell carcinoma (BCC) 05/04/2018   Right Lower Leg (MOH's)   OA (osteoarthritis)    BL hips   Prostate cancer (HCC) 02/13/2021   stage 3C   Sleep apnea    wears CPAP    Superficial basal cell carcinoma (BCC) 08/26/2011   Right Upper Back (never treated)   Wears contact lenses     Past Surgical History:  Procedure Laterality Date   APPENDECTOMY     ARTHRODESIS METATARSALPHALANGEAL JOINT (MTPJ) Right 11/19/2022   Procedure: RIGHT GREAT TOE METATARSALPHALANGEAL JOINT (MTPJ) FUSION, WEIL OSTEOTOMY 2ND METATARSAL AND PROXIMAL INTERPHALANGEAL JOINT RESECTION;  Surgeon: Harden Jerona GAILS, MD;  Location: [facility] OR;  Service: Orthopedics;  Laterality: Right;   COLONOSCOPY     DISTAL BICEPS TENDON REPAIR Left 09/15/2017   Procedure: LEFT DISTAL BICEPS TENDON REPAIR;  Surgeon: Addie Cordella Hamilton, MD;  Location: [facility] OR;  Service: Orthopedics;  Laterality: Left;   FOOT SURGERY     left foot   JOINT REPLACEMENT     B/L hips   TONSILLECTOMY     TONSILLECTOMY AND ADENOIDECTOMY     WISDOM TOOTH EXTRACTION      Allergies  Allergen Reactions   Enoxaparin Anaphylaxis, Nausea Only and Other (See Comments)    Flu like symptoms - body pain, fatigue    Midazolam  Anaphylaxis, Nausea Only and Other (See Comments)    Flu like symptoms - body pain, fatigue   Promethazine Anaphylaxis, Nausea Only and Other (See Comments)    Flu like symptoms - body pain, fatigue      Objective:  Physical Exam General: Alert and oriented x3 in no acute distress  Dermatology: Hyperkeratotic lesion(s) present on the plantar aspect of the fifth MTP right foot secondary to compensation. Pain on palpation with a central nucleated core noted. Skin is warm, dry and supple bilateral lower extremities. Negative for open lesions or macerations.  Vascular: Palpable pedal pulses bilaterally. No edema or erythema noted. Capillary refill within normal limits.  Neurological: Grossly intact via light touch  Musculoskeletal Exam: History of bunion and hammertoe repair right foot  Assessment: 1.  Symptomatic benign skin lesion plantar aspect of the fifth MTP right foot 2. H/o bunionectomy w/ hammertoe repair 2nd RT; Dr. Harden.  11/19/2022   Plan of Care:  -Patient evaluated -Excisional debridement of keratoic lesion(s) using a chisel blade was performed without incident.  - Recommend offloading of the fifth MTP with quarter inch felt dancers pads which were provided to apply to his shoes -Recommend arch supports.   -Return to clinic PRN  Thresa EMERSON Sar, DPM [facility]  Dr. Thresa EMERSON Sar, DPM    [address]  [place], [place] [address]                Office [phone]  Fax [phone]

## 2024-02-24 ENCOUNTER — Other Ambulatory Visit (HOSPITAL_BASED_OUTPATIENT_CLINIC_OR_DEPARTMENT_OTHER): Payer: Self-pay

## 2024-03-10 ENCOUNTER — Other Ambulatory Visit (HOSPITAL_BASED_OUTPATIENT_CLINIC_OR_DEPARTMENT_OTHER): Payer: Self-pay | Admitting: Family Medicine

## 2024-03-10 ENCOUNTER — Other Ambulatory Visit (HOSPITAL_BASED_OUTPATIENT_CLINIC_OR_DEPARTMENT_OTHER): Payer: Self-pay

## 2024-03-10 DIAGNOSIS — E7800 Pure hypercholesterolemia, unspecified: Secondary | ICD-10-CM

## 2024-03-10 MED ORDER — ROSUVASTATIN CALCIUM 10 MG PO TABS
10.0000 mg | ORAL_TABLET | Freq: Every day | ORAL | 3 refills | Status: DC
  Filled 2024-03-10 – 2024-05-22 (×2): qty 90, 90d supply, fill #0

## 2024-03-10 MED ORDER — TIRZEPATIDE-WEIGHT MANAGEMENT 15 MG/0.5ML ~~LOC~~ SOAJ
15.0000 mg | SUBCUTANEOUS | 5 refills | Status: AC
  Filled 2024-03-10 – 2024-04-25 (×2): qty 2, 28d supply, fill #0
  Filled 2024-05-22: qty 2, 28d supply, fill #1
  Filled 2024-06-19 – 2024-06-25 (×2): qty 2, 28d supply, fill #2

## 2024-03-10 MED ORDER — ZEPBOUND 15 MG/0.5ML ~~LOC~~ SOAJ
15.0000 mg | SUBCUTANEOUS | 0 refills | Status: AC
  Filled 2024-03-10 – 2024-03-21 (×2): qty 2, 28d supply, fill #0

## 2024-03-10 MED ORDER — CELECOXIB 200 MG PO CAPS
200.0000 mg | ORAL_CAPSULE | Freq: Every day | ORAL | 3 refills | Status: DC | PRN
  Filled 2024-03-10 – 2024-05-04 (×2): qty 90, 90d supply, fill #0

## 2024-03-21 ENCOUNTER — Other Ambulatory Visit (HOSPITAL_BASED_OUTPATIENT_CLINIC_OR_DEPARTMENT_OTHER): Payer: Self-pay

## 2024-04-05 ENCOUNTER — Ambulatory Visit (HOSPITAL_BASED_OUTPATIENT_CLINIC_OR_DEPARTMENT_OTHER)
Admission: RE | Admit: 2024-04-05 | Discharge: 2024-04-05 | Disposition: A | Discharge status: Home / Self Care | Payer: Self-pay | Source: Ambulatory Visit | Attending: Family Medicine | Admitting: Family Medicine

## 2024-04-05 DIAGNOSIS — E7800 Pure hypercholesterolemia, unspecified: Secondary | ICD-10-CM

## 2024-04-25 ENCOUNTER — Other Ambulatory Visit: Payer: Self-pay

## 2024-04-25 ENCOUNTER — Other Ambulatory Visit (HOSPITAL_BASED_OUTPATIENT_CLINIC_OR_DEPARTMENT_OTHER): Payer: Self-pay

## 2024-05-04 ENCOUNTER — Other Ambulatory Visit: Payer: Self-pay

## 2024-05-04 ENCOUNTER — Other Ambulatory Visit (HOSPITAL_BASED_OUTPATIENT_CLINIC_OR_DEPARTMENT_OTHER): Payer: Self-pay

## 2024-05-04 ENCOUNTER — Other Ambulatory Visit (HOSPITAL_COMMUNITY): Payer: Self-pay

## 2024-05-22 ENCOUNTER — Other Ambulatory Visit (HOSPITAL_BASED_OUTPATIENT_CLINIC_OR_DEPARTMENT_OTHER): Payer: Self-pay

## 2024-05-23 ENCOUNTER — Other Ambulatory Visit (HOSPITAL_BASED_OUTPATIENT_CLINIC_OR_DEPARTMENT_OTHER): Payer: Self-pay

## 2024-05-23 ENCOUNTER — Other Ambulatory Visit: Payer: Self-pay

## 2024-05-26 ENCOUNTER — Encounter (HOSPITAL_BASED_OUTPATIENT_CLINIC_OR_DEPARTMENT_OTHER): Payer: Self-pay | Admitting: Internal Medicine

## 2024-05-26 ENCOUNTER — Ambulatory Visit (HOSPITAL_BASED_OUTPATIENT_CLINIC_OR_DEPARTMENT_OTHER): Admitting: Internal Medicine

## 2024-05-26 VITALS — BP 106/70 | HR 64 | Ht 72.0 in | Wt 232.6 lb

## 2024-05-26 DIAGNOSIS — Z8546 Personal history of malignant neoplasm of prostate: Secondary | ICD-10-CM

## 2024-05-26 DIAGNOSIS — E785 Hyperlipidemia, unspecified: Secondary | ICD-10-CM | POA: Diagnosis not present

## 2024-05-26 DIAGNOSIS — E7800 Pure hypercholesterolemia, unspecified: Secondary | ICD-10-CM

## 2024-05-26 DIAGNOSIS — R931 Abnormal findings on diagnostic imaging of heart and coronary circulation: Secondary | ICD-10-CM

## 2024-05-26 MED ORDER — ROSUVASTATIN CALCIUM 20 MG PO TABS: 20.0000 mg | ORAL_TABLET | Freq: Every day | ORAL | 3 refills | Status: AC

## 2024-05-26 NOTE — Progress Notes (Unsigned)
 LIPID CLINIC CONSULT NOTE  Chief Complaint:  ***  Primary Care Physician: Seabron Lenis, MD  Primary Cardiologist:  None  HPI:  [name] [name] is a 63 y.o. male who is being seen today for the evaluation of *** at the request of Seabron Lenis, MD. ***  PMHx:  Past Medical History:  Diagnosis Date   Biceps tendon rupture    left   H/O seasonal allergies    Hypercholesterolemia    Nodular basal cell carcinoma (BCC) 05/04/2018   Right Lower Leg (MOH's)   OA (osteoarthritis)    BL hips   Prostate cancer (HCC) 02/13/2021   stage 3C   Sleep apnea    wears CPAP    Superficial basal cell carcinoma (BCC) 08/26/2011   Right Upper Back (never treated)   Wears contact lenses     Past Surgical History:  Procedure Laterality Date   APPENDECTOMY     ARTHRODESIS METATARSALPHALANGEAL JOINT (MTPJ) Right 11/19/2022   Procedure: RIGHT GREAT TOE METATARSALPHALANGEAL JOINT (MTPJ) FUSION, WEIL OSTEOTOMY 2ND METATARSAL AND PROXIMAL INTERPHALANGEAL JOINT RESECTION;  Surgeon: Harden Jerona GAILS, MD;  Location: [facility] OR;  Service: Orthopedics;  Laterality: Right;   COLONOSCOPY     DISTAL BICEPS TENDON REPAIR Left 09/15/2017   Procedure: LEFT DISTAL BICEPS TENDON REPAIR;  Surgeon: Addie Cordella Hamilton, MD;  Location: [facility] OR;  Service: Orthopedics;  Laterality: Left;   FOOT SURGERY     left foot   JOINT REPLACEMENT     B/L hips   TONSILLECTOMY     TONSILLECTOMY AND ADENOIDECTOMY     WISDOM TOOTH EXTRACTION      FAMHx:  Family History  Problem Relation Age of Onset   Alzheimer's disease Mother    Diabetes Father    Hypercholesterolemia Father     SOCHx:   reports that he has never smoked. He has never used smokeless tobacco. He reports current alcohol use. He reports that he does not use drugs.  ALLERGIES:  Allergies[1]  ROS: {Ros - complete:30496}  HOME MEDS: Medications Ordered Prior to Encounter[2]  LABS/IMAGING: No results found for this or any previous visit (from the  past 48 hours). No results found.  LIPID PANEL: No results found for: CHOL, TRIG, HDL, CHOLHDL, VLDL, LDLCALC, LDLDIRECT  No results found for: LIPOA   WEIGHTS: Wt Readings from Last 3 Encounters:  05/26/24 232 lb 9.6 oz (105.5 kg)  11/19/22 230 lb (104.3 kg)  09/15/17 240 lb (108.9 kg)    VITALS: BP 106/70   Pulse 64   Ht 6' (1.829 m)   Wt 232 lb 9.6 oz (105.5 kg)   SpO2 97%   BMI 31.55 kg/m   EXAM: {Physical Zkjf:6958881}  EKG: *** - personally reviewed  ASSESSMENT: ***  PLAN: 1.   ***  [name] KYM Maxcy, MD, [facility], FNLA, FACP  [place]  [facility]  Medical Director of the [facility]  [facility] Diplomate of the American Board of Clinical Lipidology Attending Cardiologist  Direct Dial: [phone]  Fax: [phone]  Website:  [email or web address]  [name] JAYSON [name] 05/26/2024, 3:33 PM     [1]  Allergies Allergen Reactions   Enoxaparin Anaphylaxis, Nausea Only and Other (See Comments)    Flu like symptoms - body pain, fatigue    Midazolam  Anaphylaxis, Nausea Only and Other (See Comments)    Flu like symptoms - body pain, fatigue   Promethazine Anaphylaxis, Nausea Only and Other (See Comments)    Flu like  symptoms - body pain, fatigue  [2]  Current Outpatient Medications on File Prior to Visit  Medication Sig Dispense Refill   acetaminophen  (TYLENOL ) 500 MG tablet Take 1,000 mg by mouth every 6 (six) hours as needed for moderate pain or headache.     Ascorbic Acid (VITAMIN C) 1000 MG tablet Take 1,000 mg by mouth daily.     aspirin 81 MG EC tablet Take 81 mg by mouth daily.     B Complex-C (SUPER B COMPLEX PO) Take 1 tablet by mouth daily.     celecoxib  (CELEBREX ) 200 MG capsule Take 200 mg by mouth daily.     Cholecalciferol (VITAMIN D3) 50 MCG (2000 UT) capsule Take 4,000 Units by mouth daily.     levothyroxine (SYNTHROID) 75 MCG tablet Take 75 mcg by mouth every morning. (Patient  taking differently: Take 125 mcg by mouth every morning.)     Multiple Vitamin (MULTI-VITAMIN) tablet Take 2 tablets by mouth daily.     Omega-3 1000 MG CAPS Take 2,000 mg by mouth daily.     Plant Sterols and Stanols (CHOLEST OFF PO) Take 2 capsules by mouth daily.     relugolix (ORGOVYX) 120 MG tablet Take 120 mg by mouth daily.     rosuvastatin  (CRESTOR ) 10 MG tablet Take 10 mg by mouth daily.     tirzepatide  (ZEPBOUND ) 15 MG/0.5ML Pen Inject 15 mg into the skin once a week. 2 mL 2   celecoxib  (CELEBREX ) 200 MG capsule Take 1 capsule (200 mg total) by mouth daily as needed. (Patient not taking: Reported on 05/26/2024) 90 capsule 3   clomiPHENE (CLOMID) 50 MG tablet Take 50 mg by mouth daily. (Patient not taking: Reported on 05/26/2024)     rosuvastatin  (CRESTOR ) 10 MG tablet Take 1 tablet (10 mg total) by mouth daily. (Patient not taking: Reported on 05/26/2024) 90 tablet 3   tirzepatide  (ZEPBOUND ) 15 MG/0.5ML Pen Inject 15 mg into the skin every 7 (seven) days. (Patient not taking: Reported on 05/26/2024) 2 mL 0   tirzepatide  (ZEPBOUND ) 15 MG/0.5ML Pen Inject 15 mg into the skin every 7 (seven) days. (Patient not taking: Reported on 05/26/2024) 2 mL 1   tirzepatide  (ZEPBOUND ) 15 MG/0.5ML Pen Inject 15 mg into the skin once a week. (Patient not taking: Reported on 05/26/2024) 2 mL 0   tirzepatide  (ZEPBOUND ) 15 MG/0.5ML Pen Inject 15 mg into the skin every 7 (seven) days. (Patient not taking: Reported on 05/26/2024) 2 mL 0   tirzepatide  (ZEPBOUND ) 15 MG/0.5ML Pen Inject 15 mg into the skin once a week. (Patient not taking: Reported on 05/26/2024) 2 mL 5   No current facility-administered medications on file prior to visit.

## 2024-05-26 NOTE — Patient Instructions (Signed)
 Medication Instructions:  INCREASE rosuvastatin  to 20mg  daily   *If you need a refill on your cardiac medications before your next appointment, please call your pharmacy*  Lab Work: FASTING lab work in 2 months  If you have labs (blood work) drawn today and your tests are completely normal, you will receive your results only by: MyChart Message (if you have MyChart) OR A paper copy in the mail If you have any lab test that is abnormal or we need to change your treatment, we will call you to review the results.  Testing/Procedures: NONE  Follow-Up: At [facility], you and your health needs are our priority.  As part of our continuing mission to provide you with exceptional heart care, our providers are all part of one team.  This team includes your primary Cardiologist (physician) and Advanced Practice Providers or APPs (Physician Assistants and Nurse Practitioners) who all work together to provide you with the care you need, when you need it.  Your next appointment:   2 months with Dr. Mona  We recommend signing up for the patient portal called MyChart.  Sign up information is provided on this After Visit Summary.  MyChart is used to connect with patients for Virtual Visits (Telemedicine).  Patients are able to view lab/test results, encounter notes, upcoming appointments, etc.  Non-urgent messages can be sent to your provider as well.   To learn more about what you can do with MyChart, go to [email or web address].   Other Instructions

## 2024-06-16 ENCOUNTER — Other Ambulatory Visit (HOSPITAL_BASED_OUTPATIENT_CLINIC_OR_DEPARTMENT_OTHER): Payer: Self-pay

## 2024-06-17 ENCOUNTER — Other Ambulatory Visit (HOSPITAL_BASED_OUTPATIENT_CLINIC_OR_DEPARTMENT_OTHER): Payer: Self-pay

## 2024-06-24 ENCOUNTER — Other Ambulatory Visit (HOSPITAL_BASED_OUTPATIENT_CLINIC_OR_DEPARTMENT_OTHER): Payer: Self-pay

## 2024-06-24 ENCOUNTER — Other Ambulatory Visit (HOSPITAL_COMMUNITY): Payer: Self-pay

## 2024-06-25 ENCOUNTER — Other Ambulatory Visit (HOSPITAL_BASED_OUTPATIENT_CLINIC_OR_DEPARTMENT_OTHER): Payer: Self-pay

## 2024-07-26 ENCOUNTER — Encounter (HOSPITAL_BASED_OUTPATIENT_CLINIC_OR_DEPARTMENT_OTHER): Admitting: Internal Medicine
